# Patient Record
Sex: Male | Born: 1970 | Hispanic: Yes | Marital: Married | Smoking: Never smoker
Health system: Southern US, Community
[De-identification: ages and names within clinical notes are randomized; demographics above are authoritative.]

## PROBLEM LIST (undated history)

## (undated) DIAGNOSIS — K4091 Unilateral inguinal hernia, without obstruction or gangrene, recurrent: Secondary | ICD-10-CM

## (undated) DIAGNOSIS — C8588 Other specified types of non-Hodgkin lymphoma, lymph nodes of multiple sites: Secondary | ICD-10-CM

## (undated) DIAGNOSIS — C8310 Mantle cell lymphoma, unspecified site: Secondary | ICD-10-CM

## (undated) DIAGNOSIS — Z1239 Encounter for other screening for malignant neoplasm of breast: Secondary | ICD-10-CM

## (undated) DIAGNOSIS — N6459 Other signs and symptoms in breast: Secondary | ICD-10-CM

## (undated) DIAGNOSIS — E669 Obesity, unspecified: Secondary | ICD-10-CM

## (undated) DIAGNOSIS — C8590 Non-Hodgkin lymphoma, unspecified, unspecified site: Secondary | ICD-10-CM

## (undated) DIAGNOSIS — R030 Elevated blood-pressure reading, without diagnosis of hypertension: Secondary | ICD-10-CM

## (undated) DIAGNOSIS — E785 Hyperlipidemia, unspecified: Secondary | ICD-10-CM

## (undated) DIAGNOSIS — N5089 Other specified disorders of the male genital organs: Secondary | ICD-10-CM

## (undated) DIAGNOSIS — K8010 Calculus of gallbladder with chronic cholecystitis without obstruction: Secondary | ICD-10-CM

## (undated) HISTORY — DX: Other specified types of non-hodgkin lymphoma, lymph nodes of multiple sites: C85.88

## (undated) HISTORY — DX: Obesity, unspecified: E66.9

## (undated) HISTORY — DX: Calculus of gallbladder with chronic cholecystitis without obstruction: K80.10

## (undated) HISTORY — DX: Unilateral inguinal hernia, without obstruction or gangrene, recurrent: K40.91

## (undated) HISTORY — DX: Hyperlipidemia, unspecified: E78.5

## (undated) HISTORY — DX: Other signs and symptoms in breast: N64.59

## (undated) HISTORY — DX: Encounter for other screening for malignant neoplasm of breast: Z12.39

## (undated) HISTORY — PX: PORTA CATH REMOVAL: CATH118286

## (undated) HISTORY — DX: Non-Hodgkin lymphoma, unspecified, unspecified site: C85.90

---

## 2010-08-23 DIAGNOSIS — C8588 Other specified types of non-Hodgkin lymphoma, lymph nodes of multiple sites: Secondary | ICD-10-CM

## 2010-08-23 DIAGNOSIS — N6459 Other signs and symptoms in breast: Secondary | ICD-10-CM

## 2010-08-23 HISTORY — DX: Other specified types of non-hodgkin lymphoma, lymph nodes of multiple sites: C85.88

## 2010-08-23 HISTORY — PX: AXILLARY LYMPH NODE BIOPSY: SHX5737

## 2010-08-23 HISTORY — DX: Other signs and symptoms in breast: N64.59

## 2011-07-26 ENCOUNTER — Ambulatory Visit: Payer: Self-pay | Admitting: Family Medicine

## 2011-07-30 ENCOUNTER — Ambulatory Visit: Payer: Self-pay | Admitting: Family Medicine

## 2011-08-05 ENCOUNTER — Ambulatory Visit: Payer: Self-pay | Admitting: Oncology

## 2011-08-10 ENCOUNTER — Ambulatory Visit: Payer: Self-pay | Admitting: Oncology

## 2011-08-16 ENCOUNTER — Ambulatory Visit: Payer: Self-pay | Admitting: General Surgery

## 2011-08-20 ENCOUNTER — Ambulatory Visit: Payer: Self-pay | Admitting: General Surgery

## 2011-08-24 ENCOUNTER — Ambulatory Visit: Payer: Self-pay | Admitting: Oncology

## 2011-08-24 DIAGNOSIS — Z1239 Encounter for other screening for malignant neoplasm of breast: Secondary | ICD-10-CM

## 2011-08-24 DIAGNOSIS — K8010 Calculus of gallbladder with chronic cholecystitis without obstruction: Secondary | ICD-10-CM

## 2011-08-24 DIAGNOSIS — E669 Obesity, unspecified: Secondary | ICD-10-CM

## 2011-08-24 HISTORY — DX: Obesity, unspecified: E66.9

## 2011-08-24 HISTORY — PX: CHOLECYSTECTOMY: SHX55

## 2011-08-24 HISTORY — PX: BONE MARROW TRANSPLANT: SHX200

## 2011-08-24 HISTORY — PX: PORTACATH PLACEMENT: SHX2246

## 2011-08-24 HISTORY — PX: HERNIA REPAIR: SHX51

## 2011-08-24 HISTORY — DX: Encounter for other screening for malignant neoplasm of breast: Z12.39

## 2011-08-24 HISTORY — DX: Calculus of gallbladder with chronic cholecystitis without obstruction: K80.10

## 2011-08-31 ENCOUNTER — Ambulatory Visit: Payer: Self-pay | Admitting: Oncology

## 2011-08-31 LAB — CBC CANCER CENTER
Basophil #: 0 x10 3/mm (ref 0.0–0.1)
Eosinophil: 3 %
HCT: 40.7 % (ref 40.0–52.0)
Lymphocyte %: 24.5 %
Lymphocytes: 29 %
MCHC: 34.1 g/dL (ref 32.0–36.0)
Monocyte %: 4.9 %
Monocytes: 7 %
Neutrophil %: 67.9 %
Platelet: 246 x10 3/mm (ref 150–440)
RDW: 13.9 % (ref 11.5–14.5)
Segmented Neutrophils: 61 %

## 2011-09-03 ENCOUNTER — Ambulatory Visit: Payer: Self-pay | Admitting: General Surgery

## 2011-09-07 LAB — CBC CANCER CENTER
Basophil #: 0 x10 3/mm (ref 0.0–0.1)
Basophil %: 0.5 %
HCT: 40.5 % (ref 40.0–52.0)
Lymphocyte #: 1.4 x10 3/mm (ref 1.0–3.6)
Lymphocyte %: 29.4 %
MCH: 30.3 pg (ref 26.0–34.0)
MCHC: 34.5 g/dL (ref 32.0–36.0)
Neutrophil %: 58.8 %
Platelet: 222 x10 3/mm (ref 150–440)
RDW: 14.4 % (ref 11.5–14.5)
WBC: 4.8 x10 3/mm (ref 3.8–10.6)

## 2011-09-07 LAB — COMPREHENSIVE METABOLIC PANEL
Albumin: 3.7 g/dL (ref 3.4–5.0)
Alkaline Phosphatase: 87 U/L (ref 50–136)
Anion Gap: 4 — ABNORMAL LOW (ref 7–16)
BUN: 17 mg/dL (ref 7–18)
Bilirubin,Total: 0.3 mg/dL (ref 0.2–1.0)
Chloride: 104 mmol/L (ref 98–107)
Co2: 31 mmol/L (ref 21–32)
Creatinine: 0.77 mg/dL (ref 0.60–1.30)
Glucose: 93 mg/dL (ref 65–99)
Osmolality: 279 (ref 275–301)
SGOT(AST): 16 U/L (ref 15–37)
SGPT (ALT): 24 U/L
Sodium: 139 mmol/L (ref 136–145)
Total Protein: 7.5 g/dL (ref 6.4–8.2)

## 2011-09-17 LAB — COMPREHENSIVE METABOLIC PANEL
Anion Gap: 8 (ref 7–16)
Bilirubin,Total: 0.2 mg/dL (ref 0.2–1.0)
Calcium, Total: 8.3 mg/dL — ABNORMAL LOW (ref 8.5–10.1)
Chloride: 103 mmol/L (ref 98–107)
Co2: 28 mmol/L (ref 21–32)
Creatinine: 1.03 mg/dL (ref 0.60–1.30)
EGFR (African American): >60
EGFR (Non-African Amer.): >60
Osmolality: 277 (ref 275–301)
Potassium: 3.9 mmol/L (ref 3.5–5.1)
Sodium: 139 mmol/L (ref 136–145)
Total Protein: 7.2 g/dL (ref 6.4–8.2)

## 2011-09-17 LAB — CBC CANCER CENTER
Basophil #: 0 x10 3/mm (ref 0.0–0.1)
Basophil %: 0.1 %
Eosinophil %: 1.9 %
HCT: 40.5 % (ref 40.0–52.0)
HGB: 13.9 g/dL (ref 13.0–18.0)
Lymphocyte #: 1.1 x10 3/mm (ref 1.0–3.6)
Lymphocyte %: 8.2 %
MCV: 88 fL (ref 80–100)
Monocyte %: 8.2 %
Neutrophil #: 11 x10 3/mm — ABNORMAL HIGH (ref 1.4–6.5)
Neutrophil %: 81.6 %
Platelet: 173 x10 3/mm (ref 150–440)
RDW: 14.6 % — ABNORMAL HIGH (ref 11.5–14.5)

## 2011-09-24 ENCOUNTER — Ambulatory Visit: Payer: Self-pay | Admitting: Oncology

## 2011-09-30 LAB — COMPREHENSIVE METABOLIC PANEL
Albumin: 3.5 g/dL (ref 3.4–5.0)
Alkaline Phosphatase: 106 U/L (ref 50–136)
BUN: 15 mg/dL (ref 7–18)
Bilirubin,Total: 0.2 mg/dL (ref 0.2–1.0)
Calcium, Total: 8.3 mg/dL — ABNORMAL LOW (ref 8.5–10.1)
Creatinine: 0.82 mg/dL (ref 0.60–1.30)
Glucose: 103 mg/dL — ABNORMAL HIGH (ref 65–99)
Osmolality: 282 (ref 275–301)
Sodium: 141 mmol/L (ref 136–145)
Total Protein: 7 g/dL (ref 6.4–8.2)

## 2011-09-30 LAB — CBC CANCER CENTER
Basophil %: 0.9 %
Eosinophil #: 0.3 x10 3/mm (ref 0.0–0.7)
HGB: 13.1 g/dL (ref 13.0–18.0)
MCH: 30.3 pg (ref 26.0–34.0)
MCHC: 34.8 g/dL (ref 32.0–36.0)
MCV: 87 fL (ref 80–100)
Monocyte #: 0.5 x10 3/mm (ref 0.0–0.7)
Neutrophil #: 2.2 x10 3/mm (ref 1.4–6.5)
Neutrophil %: 55.6 %
RBC: 4.3 10*6/uL — ABNORMAL LOW (ref 4.40–5.90)
RDW: 14.4 % (ref 11.5–14.5)

## 2011-10-06 LAB — COMPREHENSIVE METABOLIC PANEL
Bilirubin,Total: 0.2 mg/dL (ref 0.2–1.0)
Calcium, Total: 8.4 mg/dL — ABNORMAL LOW (ref 8.5–10.1)
Chloride: 104 mmol/L (ref 98–107)
Co2: 27 mmol/L (ref 21–32)
EGFR (African American): >60
EGFR (Non-African Amer.): >60
Glucose: 97 mg/dL (ref 65–99)
Osmolality: 279 (ref 275–301)
Potassium: 4.2 mmol/L (ref 3.5–5.1)
SGOT(AST): 19 U/L (ref 15–37)
Sodium: 139 mmol/L (ref 136–145)

## 2011-10-06 LAB — CBC CANCER CENTER
Basophil %: 0.1 %
HCT: 39 % — ABNORMAL LOW (ref 40.0–52.0)
HGB: 13.3 g/dL (ref 13.0–18.0)
Lymphocyte #: 0.8 x10 3/mm — ABNORMAL LOW (ref 1.0–3.6)
Lymphocyte %: 5.9 %
MCHC: 34.2 g/dL (ref 32.0–36.0)
MCV: 88 fL (ref 80–100)
Monocyte #: 0.9 x10 3/mm — ABNORMAL HIGH (ref 0.0–0.7)
Monocyte %: 6.4 %
RBC: 4.45 10*6/uL (ref 4.40–5.90)
WBC: 13.8 x10 3/mm — ABNORMAL HIGH (ref 3.8–10.6)

## 2011-10-21 LAB — CBC CANCER CENTER
Basophil %: 0.6 %
Eosinophil %: 5.7 %
HCT: 39.6 % — ABNORMAL LOW (ref 40.0–52.0)
HGB: 13.6 g/dL (ref 13.0–18.0)
Lymphocyte #: 1.4 x10 3/mm (ref 1.0–3.6)
Lymphocyte %: 30.9 %
MCH: 30.2 pg (ref 26.0–34.0)
Monocyte #: 0.6 x10 3/mm (ref 0.0–0.7)
Monocyte %: 13.1 %
Neutrophil #: 2.3 x10 3/mm (ref 1.4–6.5)
RBC: 4.52 10*6/uL (ref 4.40–5.90)

## 2011-10-21 LAB — COMPREHENSIVE METABOLIC PANEL
Alkaline Phosphatase: 97 U/L (ref 50–136)
Anion Gap: 13 (ref 7–16)
BUN: 21 mg/dL — ABNORMAL HIGH (ref 7–18)
Bilirubin,Total: 0.3 mg/dL (ref 0.2–1.0)
Chloride: 102 mmol/L (ref 98–107)
Co2: 27 mmol/L (ref 21–32)
Creatinine: 1 mg/dL (ref 0.60–1.30)
EGFR (African American): >60
Glucose: 92 mg/dL (ref 65–99)
Potassium: 3.8 mmol/L (ref 3.5–5.1)
SGOT(AST): 30 U/L (ref 15–37)
Total Protein: 7.3 g/dL (ref 6.4–8.2)

## 2011-10-22 ENCOUNTER — Ambulatory Visit: Payer: Self-pay | Admitting: Oncology

## 2011-10-28 LAB — CBC CANCER CENTER
Basophil #: 0.1 x10 3/mm (ref 0.0–0.1)
Eosinophil #: 0.5 x10 3/mm (ref 0.0–0.7)
HCT: 39.3 % — ABNORMAL LOW (ref 40.0–52.0)
Lymphocyte #: 0.9 x10 3/mm — ABNORMAL LOW (ref 1.0–3.6)
Lymphocyte %: 6.4 %
MCH: 30.8 pg (ref 26.0–34.0)
MCHC: 34.8 g/dL (ref 32.0–36.0)
MCV: 89 fL (ref 80–100)
Monocyte %: 10.5 %
Neutrophil #: 11.2 x10 3/mm — ABNORMAL HIGH (ref 1.4–6.5)
Platelet: 216 x10 3/mm (ref 150–440)
RDW: 15.8 % — ABNORMAL HIGH (ref 11.5–14.5)
WBC: 14.2 x10 3/mm — ABNORMAL HIGH (ref 3.8–10.6)

## 2011-10-28 LAB — COMPREHENSIVE METABOLIC PANEL
Albumin: 3.8 g/dL (ref 3.4–5.0)
Alkaline Phosphatase: 196 U/L — ABNORMAL HIGH (ref 50–136)
Bilirubin,Total: 0.2 mg/dL (ref 0.2–1.0)
Calcium, Total: 8.7 mg/dL (ref 8.5–10.1)
Chloride: 103 mmol/L (ref 98–107)
Creatinine: 0.88 mg/dL (ref 0.60–1.30)
EGFR (African American): >60
SGPT (ALT): 54 U/L
Total Protein: 7.3 g/dL (ref 6.4–8.2)

## 2011-11-11 LAB — COMPREHENSIVE METABOLIC PANEL
Albumin: 3.9 g/dL (ref 3.4–5.0)
Anion Gap: 7 (ref 7–16)
Calcium, Total: 8.7 mg/dL (ref 8.5–10.1)
Co2: 27 mmol/L (ref 21–32)
EGFR (Non-African Amer.): >60
Glucose: 107 mg/dL — ABNORMAL HIGH (ref 65–99)
Osmolality: 281 (ref 275–301)
Potassium: 3.5 mmol/L (ref 3.5–5.1)
SGOT(AST): 31 U/L (ref 15–37)
Sodium: 140 mmol/L (ref 136–145)
Total Protein: 7 g/dL (ref 6.4–8.2)

## 2011-11-11 LAB — CBC CANCER CENTER
Eosinophil #: 0.3 x10 3/mm (ref 0.0–0.7)
Eosinophil %: 6.3 %
Lymphocyte #: 1 x10 3/mm (ref 1.0–3.6)
Lymphocyte %: 20.3 %
MCHC: 34.3 g/dL (ref 32.0–36.0)
MCV: 88 fL (ref 80–100)
Monocyte %: 10.2 %
Platelet: 253 x10 3/mm (ref 150–440)
RBC: 4.5 10*6/uL (ref 4.40–5.90)
RDW: 15.6 % — ABNORMAL HIGH (ref 11.5–14.5)
WBC: 4.9 x10 3/mm (ref 3.8–10.6)

## 2011-11-18 LAB — CBC CANCER CENTER
Basophil #: 0 x10 3/mm (ref 0.0–0.1)
Basophil %: 0.1 %
Eosinophil %: 5.4 %
HCT: 38.5 % — ABNORMAL LOW (ref 40.0–52.0)
Lymphocyte #: 0.6 x10 3/mm — ABNORMAL LOW (ref 1.0–3.6)
Lymphocyte %: 6 %
MCHC: 34 g/dL (ref 32.0–36.0)
MCV: 89 fL (ref 80–100)
Neutrophil %: 81.4 %
Platelet: 201 x10 3/mm (ref 150–440)
RDW: 16.1 % — ABNORMAL HIGH (ref 11.5–14.5)

## 2011-11-18 LAB — COMPREHENSIVE METABOLIC PANEL
Albumin: 3.4 g/dL (ref 3.4–5.0)
Alkaline Phosphatase: 185 U/L — ABNORMAL HIGH (ref 50–136)
Anion Gap: 8 (ref 7–16)
BUN: 15 mg/dL (ref 7–18)
Calcium, Total: 8.2 mg/dL — ABNORMAL LOW (ref 8.5–10.1)
Chloride: 107 mmol/L (ref 98–107)
Creatinine: 0.9 mg/dL (ref 0.60–1.30)
EGFR (Non-African Amer.): >60
Glucose: 104 mg/dL — ABNORMAL HIGH (ref 65–99)
Osmolality: 286 (ref 275–301)
Potassium: 4 mmol/L (ref 3.5–5.1)
SGPT (ALT): 120 U/L — ABNORMAL HIGH

## 2011-11-22 ENCOUNTER — Ambulatory Visit: Payer: Self-pay | Admitting: Oncology

## 2011-12-02 LAB — COMPREHENSIVE METABOLIC PANEL
Anion Gap: 9 (ref 7–16)
BUN: 18 mg/dL (ref 7–18)
Calcium, Total: 8.2 mg/dL — ABNORMAL LOW (ref 8.5–10.1)
Co2: 26 mmol/L (ref 21–32)
Creatinine: 0.85 mg/dL (ref 0.60–1.30)
EGFR (African American): >60
SGOT(AST): 25 U/L (ref 15–37)
Total Protein: 7 g/dL (ref 6.4–8.2)

## 2011-12-02 LAB — CBC CANCER CENTER
Basophil #: 0 x10 3/mm (ref 0.0–0.1)
Basophil %: 0.7 %
HCT: 37.6 % — ABNORMAL LOW (ref 40.0–52.0)
HGB: 12.9 g/dL — ABNORMAL LOW (ref 13.0–18.0)
Lymphocyte #: 1 x10 3/mm (ref 1.0–3.6)
MCHC: 34.2 g/dL (ref 32.0–36.0)
Monocyte #: 0.4 x10 3/mm (ref 0.2–1.0)
Neutrophil #: 2.3 x10 3/mm (ref 1.4–6.5)
RDW: 15.5 % — ABNORMAL HIGH (ref 11.5–14.5)
WBC: 4.1 x10 3/mm (ref 3.8–10.6)

## 2011-12-09 LAB — COMPREHENSIVE METABOLIC PANEL
Albumin: 3.9 g/dL (ref 3.4–5.0)
BUN: 14 mg/dL (ref 7–18)
Co2: 28 mmol/L (ref 21–32)
Creatinine: 0.78 mg/dL (ref 0.60–1.30)
EGFR (Non-African Amer.): >60
Osmolality: 285 (ref 275–301)
Potassium: 3.8 mmol/L (ref 3.5–5.1)
Sodium: 143 mmol/L (ref 136–145)
Total Protein: 7.3 g/dL (ref 6.4–8.2)

## 2011-12-09 LAB — CBC CANCER CENTER
Basophil #: 0.1 x10 3/mm (ref 0.0–0.1)
Basophil %: 0.7 %
Eosinophil #: 0.7 x10 3/mm (ref 0.0–0.7)
HGB: 13.9 g/dL (ref 13.0–18.0)
Lymphocyte #: 0.7 x10 3/mm — ABNORMAL LOW (ref 1.0–3.6)
MCHC: 33.2 g/dL (ref 32.0–36.0)
Neutrophil #: 10.1 x10 3/mm — ABNORMAL HIGH (ref 1.4–6.5)
Neutrophil %: 77.6 %
Platelet: 241 x10 3/mm (ref 150–440)
RBC: 4.68 10*6/uL (ref 4.40–5.90)
RDW: 15.5 % — ABNORMAL HIGH (ref 11.5–14.5)

## 2011-12-22 ENCOUNTER — Ambulatory Visit: Payer: Self-pay | Admitting: Oncology

## 2011-12-23 LAB — COMPREHENSIVE METABOLIC PANEL
Albumin: 3.9 g/dL (ref 3.4–5.0)
Alkaline Phosphatase: 90 U/L (ref 50–136)
BUN: 18 mg/dL (ref 7–18)
Bilirubin,Total: 0.4 mg/dL (ref 0.2–1.0)
Calcium, Total: 8.7 mg/dL (ref 8.5–10.1)
Chloride: 106 mmol/L (ref 98–107)
Creatinine: 0.77 mg/dL (ref 0.60–1.30)
EGFR (African American): >60
SGOT(AST): 28 U/L (ref 15–37)
Total Protein: 7.2 g/dL (ref 6.4–8.2)

## 2011-12-23 LAB — CBC CANCER CENTER
Basophil #: 0.1 x10 3/mm (ref 0.0–0.1)
Basophil %: 1.8 %
Eosinophil #: 0.5 x10 3/mm (ref 0.0–0.7)
Eosinophil %: 10.2 %
HCT: 40 % (ref 40.0–52.0)
MCH: 29.8 pg (ref 26.0–34.0)
MCV: 89 fL (ref 80–100)
Monocyte #: 0.6 x10 3/mm (ref 0.2–1.0)
Monocyte %: 12.7 %
Neutrophil %: 55.9 %
Platelet: 267 x10 3/mm (ref 150–440)
RBC: 4.51 10*6/uL (ref 4.40–5.90)
RDW: 15.4 % — ABNORMAL HIGH (ref 11.5–14.5)

## 2012-01-04 LAB — CBC CANCER CENTER
Basophil %: 0.8 %
Comment - H1-Com1: NORMAL
Eosinophil #: 0.3 x10 3/mm (ref 0.0–0.7)
Eosinophil: 4 %
HGB: 13.2 g/dL (ref 13.0–18.0)
Lymphocyte #: 0.7 x10 3/mm — ABNORMAL LOW (ref 1.0–3.6)
Lymphocyte %: 14.5 %
MCV: 90 fL (ref 80–100)
Monocyte #: 0.4 x10 3/mm (ref 0.2–1.0)
Monocyte %: 8.3 %
Monocytes: 5 %
Neutrophil #: 3.3 x10 3/mm (ref 1.4–6.5)
Neutrophil %: 70.8 %
RBC: 4.47 10*6/uL (ref 4.40–5.90)
WBC: 4.6 x10 3/mm (ref 3.8–10.6)

## 2012-01-13 ENCOUNTER — Ambulatory Visit: Payer: Self-pay | Admitting: Oncology

## 2012-01-22 ENCOUNTER — Ambulatory Visit: Payer: Self-pay | Admitting: Oncology

## 2012-01-24 ENCOUNTER — Ambulatory Visit: Payer: Self-pay | Admitting: General Surgery

## 2012-01-24 LAB — HEPATIC FUNCTION PANEL A (ARMC)
Albumin: 3.9 g/dL (ref 3.4–5.0)
Alkaline Phosphatase: 72 U/L (ref 50–136)
Bilirubin, Direct: <0.1 mg/dL (ref 0.00–0.20)
SGPT (ALT): 33 U/L
Total Protein: 7.2 g/dL (ref 6.4–8.2)

## 2012-01-24 LAB — CBC WITH DIFFERENTIAL/PLATELET
Basophil %: 1.3 %
HGB: 13.4 g/dL (ref 13.0–18.0)
Lymphocyte %: 27.9 %
MCHC: 33.9 g/dL (ref 32.0–36.0)
MCV: 89 fL (ref 80–100)
Monocyte %: 10.8 %
Neutrophil %: 50.9 %
RBC: 4.43 10*6/uL (ref 4.40–5.90)
RDW: 14.1 % (ref 11.5–14.5)

## 2012-01-31 ENCOUNTER — Ambulatory Visit: Payer: Self-pay | Admitting: General Surgery

## 2012-02-01 LAB — PATHOLOGY REPORT

## 2012-03-22 ENCOUNTER — Ambulatory Visit: Payer: Self-pay | Admitting: Oncology

## 2012-03-22 DIAGNOSIS — I059 Rheumatic mitral valve disease, unspecified: Secondary | ICD-10-CM

## 2012-05-08 ENCOUNTER — Ambulatory Visit: Payer: Self-pay | Admitting: Oncology

## 2012-05-11 ENCOUNTER — Ambulatory Visit: Payer: Self-pay | Admitting: Oncology

## 2012-06-22 ENCOUNTER — Ambulatory Visit: Payer: Self-pay | Admitting: Oncology

## 2012-06-22 LAB — CBC CANCER CENTER
Basophil #: 0.1 x10 3/mm (ref 0.0–0.1)
Basophil %: 0.8 %
Eosinophil #: 0.5 x10 3/mm (ref 0.0–0.7)
HGB: 12.6 g/dL — ABNORMAL LOW (ref 13.0–18.0)
Lymphocyte %: 26.2 %
MCH: 31.1 pg (ref 26.0–34.0)
MCHC: 33.4 g/dL (ref 32.0–36.0)
Monocyte #: 0.6 x10 3/mm (ref 0.2–1.0)
Neutrophil %: 59 %
Platelet: 210 x10 3/mm (ref 150–440)

## 2012-06-22 LAB — COMPREHENSIVE METABOLIC PANEL
Alkaline Phosphatase: 81 U/L (ref 50–136)
Anion Gap: 13 (ref 7–16)
Bilirubin,Total: 0.2 mg/dL (ref 0.2–1.0)
Chloride: 104 mmol/L (ref 98–107)
Co2: 22 mmol/L (ref 21–32)
Creatinine: 0.77 mg/dL (ref 0.60–1.30)
EGFR (Non-African Amer.): >60
Osmolality: 280 (ref 275–301)
SGOT(AST): 32 U/L (ref 15–37)

## 2012-06-23 ENCOUNTER — Ambulatory Visit: Payer: Self-pay | Admitting: Oncology

## 2012-06-29 LAB — COMPREHENSIVE METABOLIC PANEL
Alkaline Phosphatase: 74 U/L (ref 50–136)
Calcium, Total: 9 mg/dL (ref 8.5–10.1)
Co2: 25 mmol/L (ref 21–32)
EGFR (African American): >60
EGFR (Non-African Amer.): >60
Potassium: 3.6 mmol/L (ref 3.5–5.1)
SGOT(AST): 55 U/L — ABNORMAL HIGH (ref 15–37)
SGPT (ALT): 118 U/L — ABNORMAL HIGH (ref 12–78)
Sodium: 140 mmol/L (ref 136–145)

## 2012-06-29 LAB — CBC CANCER CENTER
Basophil #: 0.1 x10 3/mm (ref 0.0–0.1)
Lymphocyte %: 39.4 %
Monocyte %: 8.4 %
Platelet: 181 x10 3/mm (ref 150–440)
RBC: 4.22 10*6/uL — ABNORMAL LOW (ref 4.40–5.90)
RDW: 18.1 % — ABNORMAL HIGH (ref 11.5–14.5)
WBC: 7.3 x10 3/mm (ref 3.8–10.6)

## 2012-07-06 LAB — CBC CANCER CENTER
Basophil #: 0.1 x10 3/mm (ref 0.0–0.1)
Eosinophil #: 0.4 x10 3/mm (ref 0.0–0.7)
HCT: 39.4 % — ABNORMAL LOW (ref 40.0–52.0)
Lymphocyte %: 47.5 %
MCH: 31.7 pg (ref 26.0–34.0)
MCV: 94 fL (ref 80–100)
Monocyte %: 7.9 %
Neutrophil %: 37.7 %
RDW: 18 % — ABNORMAL HIGH (ref 11.5–14.5)
WBC: 8 x10 3/mm (ref 3.8–10.6)

## 2012-07-06 LAB — COMPREHENSIVE METABOLIC PANEL
Anion Gap: 11 (ref 7–16)
BUN: 19 mg/dL — ABNORMAL HIGH (ref 7–18)
Bilirubin,Total: 0.4 mg/dL (ref 0.2–1.0)
Calcium, Total: 9.1 mg/dL (ref 8.5–10.1)
Chloride: 105 mmol/L (ref 98–107)
Creatinine: 0.9 mg/dL (ref 0.60–1.30)
EGFR (African American): >60
Glucose: 94 mg/dL (ref 65–99)
Osmolality: 283 (ref 275–301)
Potassium: 3.9 mmol/L (ref 3.5–5.1)
SGPT (ALT): 71 U/L (ref 12–78)
Total Protein: 6.7 g/dL (ref 6.4–8.2)

## 2012-07-23 ENCOUNTER — Ambulatory Visit: Payer: Self-pay | Admitting: Oncology

## 2012-07-24 ENCOUNTER — Ambulatory Visit: Payer: Self-pay | Admitting: Oncology

## 2012-08-23 DIAGNOSIS — K4091 Unilateral inguinal hernia, without obstruction or gangrene, recurrent: Secondary | ICD-10-CM

## 2012-08-23 HISTORY — PX: HERNIA REPAIR: SHX51

## 2012-08-23 HISTORY — DX: Unilateral inguinal hernia, without obstruction or gangrene, recurrent: K40.91

## 2012-09-21 ENCOUNTER — Ambulatory Visit: Payer: Self-pay | Admitting: Oncology

## 2012-09-21 LAB — CBC CANCER CENTER
Basophil #: 0 x10 3/mm (ref 0.0–0.1)
Basophil %: 0.4 %
Eosinophil #: 0.2 x10 3/mm (ref 0.0–0.7)
HGB: 14.1 g/dL (ref 13.0–18.0)
Lymphocyte #: 2.7 x10 3/mm (ref 1.0–3.6)
Lymphocyte %: 38.8 %
MCH: 32.4 pg (ref 26.0–34.0)
MCHC: 34.6 g/dL (ref 32.0–36.0)
MCV: 94 fL (ref 80–100)
Monocyte #: 0.4 x10 3/mm (ref 0.2–1.0)
Monocyte %: 6.5 %
Neutrophil #: 3.6 x10 3/mm (ref 1.4–6.5)
RBC: 4.35 10*6/uL — ABNORMAL LOW (ref 4.40–5.90)
WBC: 6.9 x10 3/mm (ref 3.8–10.6)

## 2012-09-21 LAB — LACTATE DEHYDROGENASE: LDH: 187 U/L (ref 85–241)

## 2012-09-23 ENCOUNTER — Ambulatory Visit: Payer: Self-pay | Admitting: Oncology

## 2012-10-21 ENCOUNTER — Ambulatory Visit: Payer: Self-pay | Admitting: Oncology

## 2012-10-26 ENCOUNTER — Ambulatory Visit: Payer: Self-pay | Admitting: General Surgery

## 2012-10-26 LAB — CBC WITH DIFFERENTIAL/PLATELET
Basophil #: 0 10*3/uL (ref 0.0–0.1)
Basophil %: 0.7 %
Eosinophil #: 0.4 10*3/uL (ref 0.0–0.7)
Eosinophil %: 9.7 %
HCT: 41.2 % (ref 40.0–52.0)
HGB: 14.2 g/dL (ref 13.0–18.0)
MCH: 31.5 pg (ref 26.0–34.0)
MCHC: 34.6 g/dL (ref 32.0–36.0)
MCV: 91 fL (ref 80–100)
Monocyte #: 0.3 x10 3/mm (ref 0.2–1.0)
Monocyte %: 7.1 %
Neutrophil #: 2.6 10*3/uL (ref 1.4–6.5)
Neutrophil %: 59.2 %
Platelet: 216 10*3/uL (ref 150–440)
RBC: 4.52 10*6/uL (ref 4.40–5.90)
RDW: 14 % (ref 11.5–14.5)
WBC: 4.4 10*3/uL (ref 3.8–10.6)

## 2012-11-03 ENCOUNTER — Ambulatory Visit: Payer: Self-pay | Admitting: General Surgery

## 2012-11-03 DIAGNOSIS — Z302 Encounter for sterilization: Secondary | ICD-10-CM

## 2012-11-03 DIAGNOSIS — K4091 Unilateral inguinal hernia, without obstruction or gangrene, recurrent: Secondary | ICD-10-CM

## 2012-11-03 HISTORY — PX: VASECTOMY: SHX75

## 2012-11-07 LAB — PATHOLOGY REPORT

## 2012-11-13 ENCOUNTER — Encounter: Payer: Self-pay | Admitting: *Deleted

## 2012-11-13 DIAGNOSIS — C8588 Other specified types of non-Hodgkin lymphoma, lymph nodes of multiple sites: Secondary | ICD-10-CM | POA: Insufficient documentation

## 2012-11-16 ENCOUNTER — Encounter: Payer: Self-pay | Admitting: General Surgery

## 2012-11-16 ENCOUNTER — Ambulatory Visit (INDEPENDENT_AMBULATORY_CARE_PROVIDER_SITE_OTHER): Payer: PRIVATE HEALTH INSURANCE | Admitting: General Surgery

## 2012-11-16 VITALS — BP 112/78 | HR 80 | Resp 14 | Ht 66.0 in | Wt 194.0 lb

## 2012-11-16 DIAGNOSIS — Z3009 Encounter for other general counseling and advice on contraception: Secondary | ICD-10-CM

## 2012-11-16 DIAGNOSIS — K4091 Unilateral inguinal hernia, without obstruction or gangrene, recurrent: Secondary | ICD-10-CM

## 2012-11-16 NOTE — Patient Instructions (Addendum)
The need to confirm sterility with two negative semen samples was emphasized. Instructions reviewed in detail regarding sample collection with pt understanding.  Plans PET scan December 19 2012

## 2012-11-16 NOTE — Progress Notes (Signed)
Patient here today for postoperative visit from right inguinal hernia repair and vasectomy.  Doing well "sore" only using 1 or 2 pain medications a day now. Incision clean and healing. Repair is intact

## 2012-11-17 ENCOUNTER — Encounter: Payer: Self-pay | Admitting: General Surgery

## 2012-11-17 DIAGNOSIS — K4091 Unilateral inguinal hernia, without obstruction or gangrene, recurrent: Secondary | ICD-10-CM | POA: Insufficient documentation

## 2012-11-21 ENCOUNTER — Ambulatory Visit: Payer: Self-pay | Admitting: Oncology

## 2012-11-24 ENCOUNTER — Other Ambulatory Visit: Payer: Self-pay | Admitting: *Deleted

## 2012-11-24 DIAGNOSIS — Z9852 Vasectomy status: Secondary | ICD-10-CM

## 2012-11-27 LAB — SEMEN ANALYSIS

## 2012-11-29 ENCOUNTER — Telehealth: Payer: Self-pay | Admitting: *Deleted

## 2012-11-29 NOTE — Telephone Encounter (Signed)
Reviewed specimen collection with the patient.  He states he has a cup and will get another specimen for the lab but it may be several weeks, aware of his appointment 12-18-12.

## 2012-11-29 NOTE — Telephone Encounter (Signed)
Message copied by Currie Paris on Wed Nov 29, 2012  9:28 AM ------      Message from: Kieth Brightly      Created: Tue Nov 28, 2012  8:27 PM       Any reason why this was not done?      ----- Message -----         From: [facility] Lab Results In Interface         Sent: 11/28/2012   6:47 PM           To: Kieth Brightly, MD                   ------

## 2012-12-18 ENCOUNTER — Ambulatory Visit: Payer: PRIVATE HEALTH INSURANCE | Admitting: General Surgery

## 2012-12-19 ENCOUNTER — Ambulatory Visit: Payer: Self-pay | Admitting: Oncology

## 2012-12-21 ENCOUNTER — Ambulatory Visit: Payer: Self-pay | Admitting: Oncology

## 2012-12-21 LAB — CBC CANCER CENTER
Basophil #: 0 x10 3/mm (ref 0.0–0.1)
Eosinophil #: 0.2 x10 3/mm (ref 0.0–0.7)
HCT: 37.9 % — ABNORMAL LOW (ref 40.0–52.0)
HGB: 12.7 g/dL — ABNORMAL LOW (ref 13.0–18.0)
Lymphocyte #: 2 x10 3/mm (ref 1.0–3.6)
Lymphocyte %: 44.8 %
MCH: 30.5 pg (ref 26.0–34.0)
MCHC: 33.5 g/dL (ref 32.0–36.0)
MCV: 91 fL (ref 80–100)
Neutrophil #: 1.8 x10 3/mm (ref 1.4–6.5)
Neutrophil %: 38.7 %
Platelet: 266 x10 3/mm (ref 150–440)
RBC: 4.17 10*6/uL — ABNORMAL LOW (ref 4.40–5.90)
RDW: 14.4 % (ref 11.5–14.5)

## 2012-12-21 LAB — LACTATE DEHYDROGENASE: LDH: 194 U/L (ref 85–241)

## 2012-12-28 ENCOUNTER — Ambulatory Visit (INDEPENDENT_AMBULATORY_CARE_PROVIDER_SITE_OTHER): Payer: PRIVATE HEALTH INSURANCE | Admitting: General Surgery

## 2012-12-28 ENCOUNTER — Encounter: Payer: Self-pay | Admitting: General Surgery

## 2012-12-28 VITALS — BP 122/80 | HR 72 | Resp 14 | Ht 63.0 in | Wt 193.0 lb

## 2012-12-28 DIAGNOSIS — K4091 Unilateral inguinal hernia, without obstruction or gangrene, recurrent: Secondary | ICD-10-CM

## 2012-12-28 DIAGNOSIS — Z9852 Vasectomy status: Secondary | ICD-10-CM

## 2012-12-28 NOTE — Progress Notes (Signed)
This is a 42 year old male following from an right inguinal hernia 11/03/12. Patient states he is doing. well. Also had vasectomy. He brought in first sample of semen for analysis today.  Right groin incision is well healed. No hernia noted.

## 2012-12-28 NOTE — Patient Instructions (Addendum)
Patient to call if any new problems. Report on semen analysis is will be called to him when available.

## 2012-12-29 LAB — SEMEN ANALYSIS

## 2013-01-21 ENCOUNTER — Ambulatory Visit: Payer: Self-pay | Admitting: Oncology

## 2013-02-20 ENCOUNTER — Ambulatory Visit: Payer: Self-pay | Admitting: Oncology

## 2013-02-21 ENCOUNTER — Telehealth: Payer: Self-pay | Admitting: *Deleted

## 2013-02-21 NOTE — Telephone Encounter (Signed)
Aware there was an error in test number used for semen count. Will bring another sample in couple of weeks.

## 2013-03-23 ENCOUNTER — Ambulatory Visit: Payer: Self-pay | Admitting: Oncology

## 2013-03-23 LAB — CBC CANCER CENTER
Basophil %: 0.5 %
HCT: 39.6 % — ABNORMAL LOW (ref 40.0–52.0)
HGB: 13.7 g/dL (ref 13.0–18.0)
Lymphocyte #: 2.6 x10 3/mm (ref 1.0–3.6)
Lymphocyte %: 45.9 %
MCV: 91 fL (ref 80–100)
Monocyte #: 0.4 x10 3/mm (ref 0.2–1.0)
Neutrophil #: 2.3 x10 3/mm (ref 1.4–6.5)
RBC: 4.37 10*6/uL — ABNORMAL LOW (ref 4.40–5.90)
RDW: 14.4 % (ref 11.5–14.5)
WBC: 5.6 x10 3/mm (ref 3.8–10.6)

## 2013-03-23 LAB — LACTATE DEHYDROGENASE: LDH: 176 U/L (ref 85–241)

## 2013-04-23 ENCOUNTER — Ambulatory Visit: Payer: Self-pay | Admitting: Oncology

## 2013-05-23 ENCOUNTER — Ambulatory Visit: Payer: Self-pay | Admitting: Oncology

## 2013-06-20 ENCOUNTER — Ambulatory Visit: Payer: Self-pay | Admitting: Oncology

## 2013-06-22 LAB — CBC CANCER CENTER
Basophil #: 0 x10 3/mm (ref 0.0–0.1)
Basophil %: 0.8 %
Eosinophil #: 0.3 x10 3/mm (ref 0.0–0.7)
Eosinophil %: 5.3 %
HGB: 13.3 g/dL (ref 13.0–18.0)
Lymphocyte #: 2.3 x10 3/mm (ref 1.0–3.6)
Lymphocyte %: 42 %
MCH: 30.7 pg (ref 26.0–34.0)
MCHC: 33.7 g/dL (ref 32.0–36.0)
MCV: 91 fL (ref 80–100)
Monocyte %: 9.8 %
Neutrophil #: 2.3 x10 3/mm (ref 1.4–6.5)
Neutrophil %: 42.1 %
Platelet: 193 x10 3/mm (ref 150–440)
RBC: 4.33 10*6/uL — ABNORMAL LOW (ref 4.40–5.90)
RDW: 14.2 % (ref 11.5–14.5)
WBC: 5.5 x10 3/mm (ref 3.8–10.6)

## 2013-06-22 LAB — LACTATE DEHYDROGENASE: LDH: 152 U/L (ref 85–241)

## 2013-06-22 IMAGING — PT NM PET TUM IMG RESTAG (PS) SKULL BASE T - THIGH
1 of 5 series · 2 of 25 positions shown · non-contrast
Comparison: none

REASON FOR EXAM: restaging mantle cell lymphoma
COMMENTS:

[Series 3: ct wb 3.0 b30f · axial · 3.0mm · 0.98mm/px · z∈[+630,+730]mm · 2 of 494 slices shown]
[im 444/494  brain]
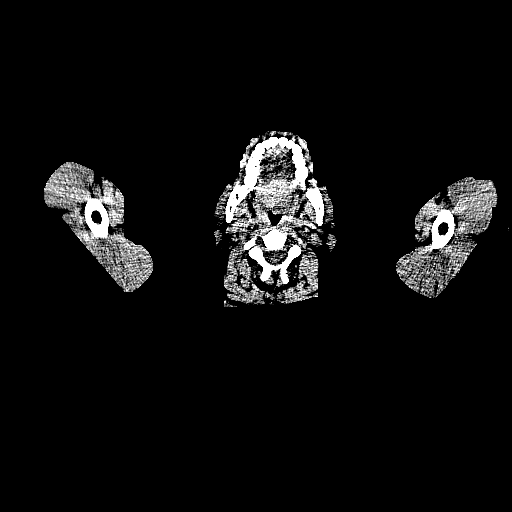
[im 494/494  brain]
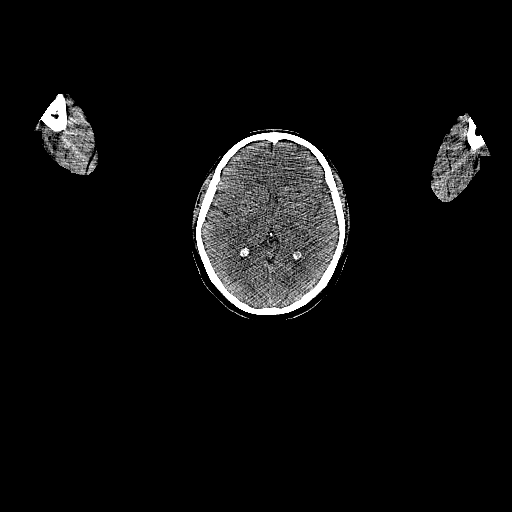

[2 of 25 positions shown; findings below may reference images not displayed]

PROCEDURE:     PET - PET/CT RESTAGING LYMPHOMA  - July 24, 2012 [DATE]

RESULT:     History: Lymphoma.

Comparison Study: PET/CT of 05/11/2012. Following determination of fasting
blood sugar 75 mg/dL 12.1 mCi of F-18 FDG was administered to the patient.
PET CT obtained. CT obtained for attenuation correction and fusion. No PET
positive abnormalities identified. Exam is unremarkable and stable from
prior study.
IMPRESSION: Negative exam.

## 2013-06-23 ENCOUNTER — Ambulatory Visit: Payer: Self-pay | Admitting: Oncology

## 2013-09-05 ENCOUNTER — Ambulatory Visit (INDEPENDENT_AMBULATORY_CARE_PROVIDER_SITE_OTHER): Payer: PRIVATE HEALTH INSURANCE | Admitting: General Surgery

## 2013-09-05 ENCOUNTER — Telehealth: Payer: Self-pay | Admitting: *Deleted

## 2013-09-05 ENCOUNTER — Encounter: Payer: Self-pay | Admitting: General Surgery

## 2013-09-05 VITALS — BP 142/80 | HR 74 | Resp 12 | Ht 63.0 in

## 2013-09-05 DIAGNOSIS — Z8579 Personal history of other malignant neoplasms of lymphoid, hematopoietic and related tissues: Secondary | ICD-10-CM

## 2013-09-05 DIAGNOSIS — K4091 Unilateral inguinal hernia, without obstruction or gangrene, recurrent: Secondary | ICD-10-CM

## 2013-09-05 DIAGNOSIS — Z87898 Personal history of other specified conditions: Secondary | ICD-10-CM

## 2013-09-05 NOTE — Telephone Encounter (Signed)
Patient aware Dr. Adalberto Cole office will be calling to arrange an appointment. They have to get clearance from his insurance first.   Caryl Pina at the [facility] has received records that we forwarded to them today.

## 2013-09-05 NOTE — Progress Notes (Signed)
Patient ID: Craig Williamson, male   DOB: 04/11/1971, 43 y.o.   MRN: [id number]  Chief Complaint  Patient presents with  . Hernia    HPI Craig Williamson is a 43 y.o. male.  Here today for follow up right inguinal hernia repair 11-03-12.  The area seems to be still swollen and painful. States he has a repeat PET scan for April at [facility]. 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 Also had RIH repair twice. His main complaint is pain.   HPI  Past Medical History  Diagnosis Date  . Calculus of gallbladder with other cholecystitis, without mention of obstruction 2013  . Inguinal hernia without mention of obstruction or gangrene, recurrent unilateral or unspecified 2014    right  . Breast screening, unspecified 2013  . Obesity, unspecified 2013  . 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 Breast complaint 2012    Past Surgical History  Procedure Laterality Date  . Hernia repair Right 2013  . Hernia repair Right 2014    recurrent RIH  . Vasectomy  11/03/2012  . Cholecystectomy  2013  . Portacath placement  2013  . Axillary lymph node biopsy Left 2012  . Bone marrow transplant  2013    No family history on file.  Social History History  Substance Use Topics  . Smoking status: Never Smoker   . Smokeless tobacco: Not on file  . Alcohol Use: No    No Known Allergies  No current outpatient prescriptions on file.   No current facility-administered medications for this visit.    Review of Systems Review of Systems  Constitutional: Negative.   Respiratory: Negative.   Cardiovascular: Negative.     Blood pressure 142/80, pulse 74, resp. rate 12, height 5\' 3"  (1.6 m).  Physical Exam Physical Exam  Constitutional: He is oriented to person, place, and time. He appears well-developed and well-nourished.  Eyes: No scleral icterus.  Neck: Neck supple.  Cardiovascular: Normal rate, regular rhythm and normal heart sounds.   Pulmonary/Chest: Effort  normal and breath sounds normal.  Abdominal: Soft. Normal appearance. There is no hepatosplenomegaly. Hernia: very small inpulse noted medial right groin, minimal swelling there when he stands up.  Lymphadenopathy:    He has no cervical adenopathy.    He has axillary adenopathy.  Neurological: He is alert and oriented to person, place, and time.  Skin: Skin is warm and dry.    Data Reviewed PET scan reviewed. Suggests mildly active nodes in both axillae  Assessment    Groin pain is likelyu from nerve irritation. May benefit with nerve block. Pt advised fully and he is agreeable.     Plan    Refer  to pain management-I discussed with Dr. Dossie Arbour and he will see pt.        Karie Fetch M 09/05/2013, 9:25 AM

## 2013-09-05 NOTE — Patient Instructions (Signed)
The patient is aware to call back for any questions or concerns.  

## 2013-09-24 ENCOUNTER — Ambulatory Visit: Payer: Self-pay | Admitting: Oncology

## 2013-10-25 ENCOUNTER — Ambulatory Visit: Payer: Self-pay | Admitting: Oncology

## 2014-01-08 ENCOUNTER — Ambulatory Visit: Payer: Self-pay | Admitting: Oncology

## 2014-01-08 LAB — LACTATE DEHYDROGENASE: LDH: 156 U/L (ref 85–241)

## 2014-01-08 LAB — CBC CANCER CENTER
BASOS ABS: 0 x10 3/mm (ref 0.0–0.1)
BASOS PCT: 0.6 %
EOS PCT: 5 %
Eosinophil #: 0.3 x10 3/mm (ref 0.0–0.7)
HCT: 39.6 % — ABNORMAL LOW (ref 40.0–52.0)
HGB: 13.6 g/dL (ref 13.0–18.0)
LYMPHS ABS: 2.7 x10 3/mm (ref 1.0–3.6)
Lymphocyte %: 46.2 %
MCH: 30.7 pg (ref 26.0–34.0)
MCHC: 34.3 g/dL (ref 32.0–36.0)
MCV: 90 fL (ref 80–100)
MONOS PCT: 6.1 %
Monocyte #: 0.4 x10 3/mm (ref 0.2–1.0)
Neutrophil #: 2.4 x10 3/mm (ref 1.4–6.5)
Neutrophil %: 42.1 %
Platelet: 212 x10 3/mm (ref 150–440)
RBC: 4.42 10*6/uL (ref 4.40–5.90)
RDW: 14.7 % — AB (ref 11.5–14.5)
WBC: 5.8 x10 3/mm (ref 3.8–10.6)

## 2014-01-15 ENCOUNTER — Ambulatory Visit: Payer: Self-pay | Admitting: Oncology

## 2014-01-21 ENCOUNTER — Ambulatory Visit: Payer: Self-pay | Admitting: Oncology

## 2014-04-24 ENCOUNTER — Ambulatory Visit: Payer: Self-pay | Admitting: Oncology

## 2014-05-23 ENCOUNTER — Ambulatory Visit: Payer: Self-pay | Admitting: Oncology

## 2014-10-22 ENCOUNTER — Ambulatory Visit (INDEPENDENT_AMBULATORY_CARE_PROVIDER_SITE_OTHER): Payer: PRIVATE HEALTH INSURANCE | Admitting: General Surgery

## 2014-10-22 ENCOUNTER — Encounter: Payer: Self-pay | Admitting: General Surgery

## 2014-10-22 VITALS — BP 140/80 | HR 76 | Resp 12 | Ht 63.0 in | Wt 198.0 lb

## 2014-10-22 DIAGNOSIS — Z8572 Personal history of non-Hodgkin lymphomas: Secondary | ICD-10-CM | POA: Diagnosis not present

## 2014-10-22 DIAGNOSIS — Z8579 Personal history of other malignant neoplasms of lymphoid, hematopoietic and related tissues: Secondary | ICD-10-CM

## 2014-10-22 NOTE — Progress Notes (Signed)
Patient ID: Craig Williamson, male   DOB: [birth year], 44 y.o.   MRN: [id number]  Chief Complaint  Patient presents with  . Procedure    port removal     HPI Craig Williamson is a 44 y.o. male here today for a port removal. Pt has had lymphoma and has undergone treatment. He has been stable with no evidence of recurrence. Oncologist felt port can be removed HPI  Past Medical History  Diagnosis Date  . Calculus of gallbladder with other cholecystitis, without mention of obstruction 2013  . Inguinal hernia without mention of obstruction or gangrene, recurrent unilateral or unspecified 2014    right  . Breast screening, unspecified 2013  . Obesity, unspecified 2013  . 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 Breast complaint 2012  . Lymphoma     Past Surgical History  Procedure Laterality Date  . Hernia repair Right 2013  . Hernia repair Right 2014    recurrent RIH  . Vasectomy  11/03/2012  . Cholecystectomy  2013  . Portacath placement  2013  . Axillary lymph node biopsy Left 2012  . Bone marrow transplant  2013    History reviewed. No pertinent family history.  Social History History  Substance Use Topics  . Smoking status: Never Smoker   . Smokeless tobacco: Not on file  . Alcohol Use: No    No Known Allergies  No current outpatient prescriptions on file.   No current facility-administered medications for this visit.    Review of Systems Review of Systems  Constitutional: Negative.   Respiratory: Negative.   Cardiovascular: Negative.     Blood pressure 140/80, pulse 76, resp. rate 12, height 5\' 3"  (1.6 m), weight 198 lb (89.812 kg).  Physical Exam Physical Exam  Data Reviewed   Assessment  Lymphoma in remission.    Plan        Procedure: removal venous port, left upper chest. Anesthetic: 1% xylocaine mixed with 0.5% marcaine, 20ml Prep: Chloro Prep  Area prepped and draped. Prior scar was excised out. Capsule around port  incised. 3 anchoring Prolene stitches removed. Port and catheter removed in full. 3-0 Vicryl placed in subcutaneous tissue. Skin closed with 4-0 Vicryl subcuticular stitch. Steri strips and telfa and tegaderm. No immediate problems from procedure.    SANKAR,SEEPLAPUTHUR G 10/22/2014, 10:49 AM

## 2014-10-22 NOTE — Patient Instructions (Addendum)
Patient to return as needed. Ice pack off and on today. Dressing can be removed in 3 days and steri strips in 1 week.

## 2014-12-13 NOTE — Op Note (Signed)
PATIENT NAME:  Craig Williamson, Craig Williamson MR#:  [id number] DATE OF BIRTH:  08-21-1971  DATE OF PROCEDURE:  11/03/2012  PREOPERATIVE DIAGNOSIS: Recurrent right inguinal hernia.   POSTOPERATIVE DIAGNOSIS: Recurrent right inguinal hernia.  OPERATION:  1. Repair of recurrent right inguinal hernia.  2. Bilateral vasectomy.  SURGEONS: S.G. Jamal Collin, MD, and Robert Bellow, MD.  COMPLICATIONS: None.   ESTIMATED BLOOD LOSS: Minimal.   DRAINS: None.   ANESTHESIA: General.   PROCEDURE: The patient was put to sleep in the supine position on the operating table. The right groin and the scrotal area were prepped and draped out as a sterile field. At the same time as the inguinal approach, the left spermatic cord in the upper scrotal region was exposed through a small incision by Dr. Bary Castilla, and the vasectomy was completed. This portion was dictated by him. The right-sided vasectomy was performed after repair of the recurrent hernia note. The previous skin incision was opened after instillation of local anesthetic and deepened through the layers down to the external oblique, with some scarring encountered. The external oblique was opened and noted that the cord was pretty much intact, but also the mesh, except for the fact that the medial and inferior edge of this mesh was lifting off, and it was not suitably transfixed. There did not appear to be any direct hernia of any preperitoneal tissue through this region. This was the only abnormality identified. The cord itself was inspected, and no indirect hernia was noted. Since the patient's primary symptom was one of pain associated with coughing, and the only finding was of a flap-like movement of the medial end of this graft, it was decided to tack this down. This was then tacked down to the pubic tubercle and the muscular tissue medially and the inguinal ligament inferiorly with 3 stitches of 2-0 PDS and appeared to stay in place without any mobility. After ensuring  hemostasis, the external oblique was closed with 2-0 PDS. Beyond the external ring area, the vas deferens were identified, it was freed, and a subcentimeter portion was resected and the 2 ends were then ligated with 3-0 Vicryl and the lumen closed with cautery. Procedure was completed similar to the left side. The wound was irrigated and closed with the subcutaneous tissue closed with 3-0 Vicryl, the skin with subcuticular 4-0 Vicryl, covered with Dermabond. The procedure was well tolerated. The patient tolerated the procedure well. There were no immediate problems encountered. He was extubated and returned to the recovery room in stable condition.   ____________________________ S.Robinette Haines, MD sgs:OSi D: 11/03/2012 15:43:16 ET T: 11/04/2012 12:21:31 ET JOB#: [id number]  cc: S.G. Jamal Collin, MD, <Dictator> [facility] Robinette Haines MD ELECTRONICALLY SIGNED 11/06/2012 18:11

## 2014-12-13 NOTE — Op Note (Signed)
PATIENT NAME:  Craig Williamson, Craig Williamson MR#:  [id number] DATE OF BIRTH:  Dec 23, 1970  DATE OF PROCEDURE:  11/03/2012  PREOPERATIVE DIAGNOSIS:  Desires sterilization.   POSTOPERATIVE DIAGNOSIS:  Desires sterilization.  OPERATIVE PROCEDURE:  Left vasectomy.   SURGEON:  Robert Bellow, MD  ANESTHESIA:  General endotracheal.   ESTIMATED BLOOD LOSS:  Minimal.   CLINICAL NOTE:  This 44 year old male was undergoing repair of recurrent right inguinal hernia. He had discussed elective vasectomy with his attending surgeon. The right vasectomy will be completed at the time of the hernia repair.   OPERATIVE NOTE:  The patient's abdomen was prepped with ChloraPrep and the perineum prepped with Betadine. A small 1 cm transverse incision was made in the left hemiscrotum and the left vas dissected free from the adjacent tissue. A 1 cm segment was excised. The vas was ligated with 3-0 Vicryl and the mucosa destroyed with electrocautery. The ends of the vas were separated by the external spermatic fascia with a 3-0 Vicryl pursestring suture. The skin was closed with a 3-0 Vicryl subcuticular suture.   The patient tolerated the procedure well. He has been instructed by his attending surgeon regarding the need for 2 negative semen samples prior to resuming unprotected intercourse.  ____________________________ Robert Bellow, MD jwb:jm D: 11/03/2012 18:29:52 ET T: 11/04/2012 13:53:59 ET JOB#: [id number]  cc: Robert Bellow, MD, <Dictator> JEFFREY Amedeo Kinsman MD ELECTRONICALLY SIGNED 11/05/2012 8:38

## 2014-12-15 NOTE — Op Note (Signed)
PATIENT NAME:  Craig Williamson, ISHIDA MR#:  [id number] DATE OF BIRTH:  13-Feb-1971  DATE OF PROCEDURE:  01/31/2012  PREOPERATIVE DIAGNOSES:  1. Cholelithiasis. 2. Right inguinal hernia.   POSTOPERATIVE DIAGNOSES:  1. Cholelithiasis. 2. Right inguinal hernia.   OPERATION:  1. Laparoscopy, cholecystectomy with cholangiogram.   2. Repair of right inguinal hernia.   SURGEON: Mckinley Jewel, MD   ANESTHESIA: General.   COMPLICATIONS: None.   ESTIMATED BLOOD LOSS: Approximately 50 mL.   DRAINS: None.   PROCEDURE: The patient was put to sleep in the supine position on the operating table. The abdomen was prepped and draped out as a sterile field. A small incision was made in the umbilicus. The Veress needle with the InnerDyne sleeve was positioned in the peritoneal cavity, verified with the hanging drop method, and pneumoperitoneum was obtained. A 10 mm port was placed. The camera was introduced. There was good visualization of the peritoneal cavity. Epigastric and two lateral 5 mm ports were placed. The gallbladder was noted to be mildly thickened and a few adhesions around the Hartmann's pouch were noted. These were easily taken down. The cystic duct was freed and a Kumar clamp and catheter were positioned. Cholangiogram was performed. There was preferential flow into the gallbladder, however, a portion of the common bile duct and the proximal hepatic duct were seen and did not appear in the visualized portions to have any filling defects. The cystic duct was fairly long and tortuous. The catheter was used to decompress the gallbladder and removed. The cystic duct was hemoclipped and cut. The cystic artery was freed, hemoclipped and cut. The gallbladder was dissected free from its bed using cautery for control of bleeding. A small amount of oozing was noted in the lower part of the gallbladder bed and this was covered with a small piece of Surgicel. A small amount of fluid was used to irrigate out the right  upper quadrant and suctioned out. The gallbladder was placed in an Endo bag and brought out through the umbilical port site and noted to contain multiple tiny stones. The fascial opening in the umbilicus was then closed with a 0 Vicryl placed with the use of a suture passer and tied down. Pneumoperitoneum was released and the remaining ports were then removed. Skin incisions were then closed with subcuticular 4-0 Vicryl and subsequently reinforced with Steri-Strips and Tegaderm dressings.  At this point gown and gloves were changed. The prior drape was removed. The right groin was then prepped and draped out as a sterile field. A standard right inguinal incision was made along the medial two-thirds of the inguinal canal and deepened through the layers down to the external oblique. Bleeding was controlled with cautery and ligatures of 3-0 Vicryl. It was noted the patient had a hernial protrusion going through the external ring. After the external oblique was opened along the line of its fibers it was noted the hernia was actually located in medial location along the posterior wall. This was a fairly long hernial protrusion with a well defined edge on the transversalis fascia of about two fingerbreadths size. No indirect sac was identified and on laparoscopy no indirect opening was noted on either side. The hernial protrusion was easily pushed back in and the transversalis fascia closed over with a running 2-0 PDS stitch. The ilioinguinal nerve was located and the superior part of the inguinal canal was preserved. A Pro Grip mesh was then placed on the posterior wall and around the internal ring  and the lateral ends tucked underneath the external oblique and smoothened out. Excess along the inferior edge was trimmed. External oblique was closed over with a running 2-0 PDS. The subcutaneous tissue was closed in two layers of 3-0 Vicryl and the skin closed with subcuticular 4-0 Vicryl reinforced with Steri-Strips. A  dry sterile dressing was placed. The patient tolerated the procedure well. There were no immediate problems encountered. He was extubated and returned to the recovery room in stable condition.   ____________________________ S.Robinette Haines, MD sgs:drc D: 01/31/2012 16:54:31 ET T: 02/01/2012 08:56:34 ET JOB#: [id number]  cc: Synthia Innocent. Jamal Collin, MD, <Dictator> [facility] Robinette Haines MD ELECTRONICALLY SIGNED 02/01/2012 17:22

## 2015-03-12 ENCOUNTER — Inpatient Hospital Stay: Payer: No Typology Code available for payment source | Attending: Oncology

## 2015-03-12 ENCOUNTER — Inpatient Hospital Stay (HOSPITAL_BASED_OUTPATIENT_CLINIC_OR_DEPARTMENT_OTHER): Payer: No Typology Code available for payment source | Admitting: Oncology

## 2015-03-12 VITALS — BP 148/94 | HR 76 | Temp 97.4°F | Resp 16 | Wt 198.6 lb

## 2015-03-12 DIAGNOSIS — Z8572 Personal history of non-Hodgkin lymphomas: Secondary | ICD-10-CM | POA: Insufficient documentation

## 2015-03-12 DIAGNOSIS — Z79899 Other long term (current) drug therapy: Secondary | ICD-10-CM | POA: Insufficient documentation

## 2015-03-12 DIAGNOSIS — C8310 Mantle cell lymphoma, unspecified site: Secondary | ICD-10-CM

## 2015-03-12 DIAGNOSIS — Z9484 Stem cells transplant status: Secondary | ICD-10-CM | POA: Diagnosis not present

## 2015-03-12 DIAGNOSIS — C8590 Non-Hodgkin lymphoma, unspecified, unspecified site: Secondary | ICD-10-CM

## 2015-03-12 LAB — LACTATE DEHYDROGENASE: LDH: 130 U/L (ref 98–192)

## 2015-03-12 LAB — CBC WITH DIFFERENTIAL/PLATELET
Basophils Absolute: 0 10*3/uL (ref 0–0.1)
Basophils Relative: 1 %
Eosinophils Absolute: 0.1 10*3/uL (ref 0–0.7)
Eosinophils Relative: 2 %
HCT: 41.4 % (ref 40.0–52.0)
HEMOGLOBIN: 13.9 g/dL (ref 13.0–18.0)
Lymphocytes Relative: 40 %
Lymphs Abs: 2.9 10*3/uL (ref 1.0–3.6)
MCH: 30.5 pg (ref 26.0–34.0)
MCHC: 33.6 g/dL (ref 32.0–36.0)
MCV: 90.5 fL (ref 80.0–100.0)
MONO ABS: 0.4 10*3/uL (ref 0.2–1.0)
MONOS PCT: 6 %
NEUTROS ABS: 3.8 10*3/uL (ref 1.4–6.5)
NEUTROS PCT: 51 %
Platelets: 255 10*3/uL (ref 150–440)
RBC: 4.57 MIL/uL (ref 4.40–5.90)
RDW: 14.3 % (ref 11.5–14.5)
WBC: 7.3 10*3/uL (ref 3.8–10.6)

## 2015-03-12 LAB — BASIC METABOLIC PANEL
Anion gap: 6 (ref 5–15)
BUN: 23 mg/dL — ABNORMAL HIGH (ref 6–20)
CALCIUM: 8.4 mg/dL — AB (ref 8.9–10.3)
CO2: 25 mmol/L (ref 22–32)
CREATININE: 0.83 mg/dL (ref 0.61–1.24)
Chloride: 104 mmol/L (ref 101–111)
GFR calc Af Amer: >60 mL/min (ref >60)
Glucose, Bld: 88 mg/dL (ref 65–99)
POTASSIUM: 3.5 mmol/L (ref 3.5–5.1)
Sodium: 135 mmol/L (ref 135–145)

## 2015-03-23 DIAGNOSIS — C8310 Mantle cell lymphoma, unspecified site: Secondary | ICD-10-CM | POA: Insufficient documentation

## 2015-03-23 NOTE — Progress Notes (Signed)
[place]  Telephone:([phone]) [phone] Fax:(336) [phone]  ID: Craig Williamson OB: October 23, 1970  MR#: [id number]  YBW#:[id number]  Patient Care Team: Denton Lank, MD as PCP - General (Family Medicine) Christene Lye, MD (General Surgery) Lloyd Huger, MD as Consulting Physician (Oncology)  CHIEF COMPLAINT:  Chief Complaint  Patient presents with  . Follow-up    lymphoma    INTERVAL HISTORY: Patient last evaluated in clinic in May 2015. He currently feels well and is asymptomatic. He remains active and is working full-time.  He has no neurologic complaints. He denies any recent fevers, night sweats, or illnesses.  He denies any weight loss.  He denies any chest pain or shortness of breath.  He has a good appetite and denies any nausea, vomiting, constipation, or diarrhea.  He has no urinary complaints.  Patient offers no specific complaints today.  REVIEW OF SYSTEMS:   Review of Systems  Constitutional: Negative for fever, chills and weight loss.  Neurological: Negative.     As per HPI. Otherwise, a complete review of systems is negatve.  PAST MEDICAL HISTORY: Past Medical History  Diagnosis Date  . Calculus of gallbladder with other cholecystitis, without mention of obstruction 2013  . Inguinal hernia without mention of obstruction or gangrene, recurrent unilateral or unspecified 2014    right  . Breast screening, unspecified 2013  . Obesity, unspecified 2013  . 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 Breast complaint 2012  . Lymphoma     PAST SURGICAL HISTORY: Past Surgical History  Procedure Laterality Date  . Hernia repair Right 2013  . Hernia repair Right 2014    recurrent RIH  . Vasectomy  11/03/2012  . Cholecystectomy  2013  . Portacath placement  2013  . Axillary lymph node biopsy Left 2012  . Bone marrow transplant  2013    FAMILY HISTORY No family history on file.     ADVANCED DIRECTIVES:     HEALTH MAINTENANCE: History  Substance Use Topics  . Smoking status: Never Smoker   . Smokeless tobacco: Not on file  . Alcohol Use: No     Colonoscopy:  PAP:  Bone density:  Lipid panel:  No Known Allergies  Current Outpatient Prescriptions  Medication Sig Dispense Refill  . calcium carbonate (OS-CAL) 600 MG TABS tablet Take 600 mg by mouth 2 (two) times daily with a meal.     No current facility-administered medications for this visit.    OBJECTIVE: Filed Vitals:   03/12/15 0932  BP: 148/94  Pulse: 76  Temp: 97.4 F (36.3 C)  Resp: 16     Body mass index is 35.2 kg/(m^2).    ECOG FS:0 - Asymptomatic  General: Well-developed, well-nourished, no acute distress. Eyes: Pink conjunctiva, anicteric sclera. Lungs: Clear to auscultation bilaterally. Heart: Regular rate and rhythm. No rubs, murmurs, or gallops. Abdomen: Soft, nontender, nondistended. No organomegaly noted, normoactive bowel sounds. Musculoskeletal: No edema, cyanosis, or clubbing. Neuro: Alert, answering all questions appropriately. Cranial nerves grossly intact. Skin: No rashes or petechiae noted. Psych: Normal affect. Lymphatics: No cervical, calvicular, axillary or inguinal LAD.   LAB RESULTS:  Lab Results  Component Value Date   NA 135 03/12/2015   K 3.5 03/12/2015   CL 104 03/12/2015   CO2 25 03/12/2015   GLUCOSE 88 03/12/2015   BUN 23* 03/12/2015   CREATININE 0.83 03/12/2015   CALCIUM 8.4* 03/12/2015   PROT 6.7 07/06/2012   ALBUMIN 3.8 07/06/2012  AST 35 07/06/2012   ALT 71 07/06/2012   ALKPHOS 74 07/06/2012   BILITOT 0.4 07/06/2012   GFRNONAA >60 03/12/2015   GFRAA >60 03/12/2015    Lab Results  Component Value Date   WBC 7.3 03/12/2015   NEUTROABS 3.8 03/12/2015   HGB 13.9 03/12/2015   HCT 41.4 03/12/2015   MCV 90.5 03/12/2015   PLT 255 03/12/2015     STUDIES: No results found.  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  PLAN:    1.  Mantle cell lymphoma: Restaging PET scan in May 2015 was reviewed independently and reported no evidence of recurrence. No intervention is needed at this time. Return to clinic in 6 months with repeat laboratory work and further evaluation. Will consider ordering reimaging at this visit if necessary.   Patient expressed understanding and was in agreement with this plan. He also understands that He can call clinic at any time with any questions, concerns, or complaints.   Mantle cell lymphoma   Staging form: Lymphoid Neoplasms, AJCC 6th Edition     Clinical stage from 03/23/2015: Stage III - Signed by Lloyd Huger, MD on 03/23/2015   Lloyd Huger, MD   03/23/2015 9:12 AM

## 2015-09-16 ENCOUNTER — Other Ambulatory Visit: Payer: No Typology Code available for payment source

## 2015-09-16 ENCOUNTER — Ambulatory Visit: Payer: No Typology Code available for payment source | Admitting: Oncology

## 2015-12-15 ENCOUNTER — Inpatient Hospital Stay: Payer: PRIVATE HEALTH INSURANCE | Attending: Oncology

## 2015-12-15 ENCOUNTER — Inpatient Hospital Stay (HOSPITAL_BASED_OUTPATIENT_CLINIC_OR_DEPARTMENT_OTHER): Payer: PRIVATE HEALTH INSURANCE | Admitting: Oncology

## 2015-12-15 VITALS — BP 122/88 | HR 82 | Temp 98.3°F | Resp 16 | Wt 194.2 lb

## 2015-12-15 DIAGNOSIS — C8310 Mantle cell lymphoma, unspecified site: Secondary | ICD-10-CM | POA: Diagnosis not present

## 2015-12-15 DIAGNOSIS — E669 Obesity, unspecified: Secondary | ICD-10-CM | POA: Diagnosis not present

## 2015-12-15 DIAGNOSIS — C8590 Non-Hodgkin lymphoma, unspecified, unspecified site: Secondary | ICD-10-CM

## 2015-12-15 DIAGNOSIS — Z9484 Stem cells transplant status: Secondary | ICD-10-CM | POA: Insufficient documentation

## 2015-12-15 DIAGNOSIS — Z79899 Other long term (current) drug therapy: Secondary | ICD-10-CM

## 2015-12-15 DIAGNOSIS — Z6834 Body mass index (BMI) 34.0-34.9, adult: Secondary | ICD-10-CM | POA: Insufficient documentation

## 2015-12-15 LAB — CBC WITH DIFFERENTIAL/PLATELET
Basophils Absolute: 0 10*3/uL (ref 0–0.1)
Basophils Relative: 0 %
EOS PCT: 2 %
Eosinophils Absolute: 0.1 10*3/uL (ref 0–0.7)
HCT: 44 % (ref 40.0–52.0)
Hemoglobin: 15.3 g/dL (ref 13.0–18.0)
LYMPHS ABS: 1.1 10*3/uL (ref 1.0–3.6)
LYMPHS PCT: 17 %
MCH: 31.2 pg (ref 26.0–34.0)
MCHC: 34.7 g/dL (ref 32.0–36.0)
MCV: 90.1 fL (ref 80.0–100.0)
Monocytes Absolute: 0.3 10*3/uL (ref 0.2–1.0)
Monocytes Relative: 4 %
Neutro Abs: 5.1 10*3/uL (ref 1.4–6.5)
Neutrophils Relative %: 77 %
PLATELETS: 175 10*3/uL (ref 150–440)
RBC: 4.89 MIL/uL (ref 4.40–5.90)
RDW: 14.5 % (ref 11.5–14.5)
WBC: 6.6 10*3/uL (ref 3.8–10.6)

## 2015-12-15 LAB — LACTATE DEHYDROGENASE: LDH: 72 U/L — ABNORMAL LOW (ref 98–192)

## 2015-12-15 NOTE — Progress Notes (Signed)
[place]  [phone] [phone]) [phone]  ID: Craig Williamson OB: Sep 22, 1970  MR#: [id number]  [id number]  Patient Care Team: Denton Lank, MD as PCP - General (Family Medicine) Christene Lye, MD (General Surgery) Lloyd Huger, MD as Consulting Physician (Oncology)  CHIEF COMPLAINT:  Chief Complaint  Patient presents with  . Lymphoma    INTERVAL HISTORY: Patient is here for 6 month followup. He currently feels well and is asymptomatic. He remains active and is working full-time.  He has no neurologic complaints. He denies any recent fevers, night sweats, or illnesses.  He denies any weight loss.  He denies any chest pain or shortness of breath.  He has a good appetite and denies any nausea, vomiting, constipation, or diarrhea.  He has no urinary complaints.  Patient offers no specific complaints today.  REVIEW OF SYSTEMS:   Review of Systems  Constitutional: Negative for fever, chills and weight loss.  Neurological: Negative.     As per HPI. Otherwise, a complete review of systems is negatve.  PAST MEDICAL HISTORY: Past Medical History  Diagnosis Date  . Calculus of gallbladder with other cholecystitis, without mention of obstruction 2013  . Inguinal hernia without mention of obstruction or gangrene, recurrent unilateral or unspecified 2014    right  . Breast screening, unspecified 2013  . Obesity, unspecified 2013  . 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 Breast complaint 2012  . Lymphoma     PAST SURGICAL HISTORY: Past Surgical History  Procedure Laterality Date  . Hernia repair Right 2013  . Hernia repair Right 2014    recurrent RIH  . Vasectomy  11/03/2012  . Cholecystectomy  2013  . Portacath placement  2013  . Axillary lymph node biopsy Left 2012  . Bone marrow transplant  2013    FAMILY HISTORY: Reviewed and unchanged. No reported history of malignancy or chronic  disease.     ADVANCED DIRECTIVES:    HEALTH MAINTENANCE: Social History  Substance Use Topics  . Smoking status: Never Smoker   . Smokeless tobacco: Not on file  . Alcohol Use: No     No Known Allergies  No current outpatient prescriptions on file.   No current facility-administered medications for this visit.    OBJECTIVE: Filed Vitals:   12/15/15 0953  BP: 122/88  Pulse: 82  Temp: 98.3 F (36.8 C)  Resp: 16     Body mass index is 34.41 kg/(m^2).    ECOG FS:0 - Asymptomatic  General: Well-developed, well-nourished, no acute distress. Eyes: Pink conjunctiva, anicteric sclera. Lungs: Clear to auscultation bilaterally. Heart: Regular rate and rhythm. No rubs, murmurs, or gallops. Abdomen: Soft, nontender, nondistended. No organomegaly noted, normoactive bowel sounds. Musculoskeletal: No edema, cyanosis, or clubbing. Neuro: Alert, answering all questions appropriately. Cranial nerves grossly intact. Skin: No rashes or petechiae noted. Psych: Normal affect. Lymphatics: No cervical, calvicular, axillary or LAD.   LAB RESULTS:  Lab Results  Component Value Date   NA 135 03/12/2015   K 3.5 03/12/2015   CL 104 03/12/2015   CO2 25 03/12/2015   GLUCOSE 88 03/12/2015   BUN 23* 03/12/2015   CREATININE 0.83 03/12/2015   CALCIUM 8.4* 03/12/2015   PROT 6.7 07/06/2012   ALBUMIN 3.8 07/06/2012   AST 35 07/06/2012   ALT 71 07/06/2012   ALKPHOS 74 07/06/2012   BILITOT 0.4 07/06/2012   GFRNONAA >60 03/12/2015   GFRAA >60 03/12/2015    Lab Results  Component Value Date   WBC 6.6 12/15/2015   NEUTROABS 5.1 12/15/2015   HGB 15.3 12/15/2015   HCT 44.0 12/15/2015   MCV 90.1 12/15/2015   PLT 175 12/15/2015     STUDIES: No results found.  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  PLAN:    1.  Mantle cell lymphoma: No clinical signs of recurrence therefore no intervention is needed at this time.  Return to clinic in 1 year with repeat laboratory work and further evaluation. Probably discharge at next visit if all is well.    Patient expressed understanding and was in agreement with this plan. He also understands that He can call clinic at any time with any questions, concerns, or complaints.   Mantle cell lymphoma   Staging form: Lymphoid Neoplasms, AJCC 6th Edition     Clinical stage from 03/23/2015: Stage III - Signed by Lloyd Huger, MD on 03/23/2015   Mayra Reel, NP   12/15/2015 10:07 AM  Patient was seen and evaluated independently and I agree with the assessment and plan as dictated above.  Lloyd Huger, MD 12/15/2015 11:21 AM

## 2015-12-15 NOTE — Progress Notes (Signed)
Patient does not offer any problems today.  

## 2016-12-14 ENCOUNTER — Inpatient Hospital Stay: Payer: PRIVATE HEALTH INSURANCE | Admitting: Oncology

## 2016-12-14 ENCOUNTER — Inpatient Hospital Stay: Payer: PRIVATE HEALTH INSURANCE

## 2023-07-05 ENCOUNTER — Inpatient Hospital Stay: Payer: BC Managed Care – PPO | Attending: Oncology | Admitting: Oncology

## 2023-07-05 ENCOUNTER — Inpatient Hospital Stay: Payer: BC Managed Care – PPO

## 2023-07-05 ENCOUNTER — Encounter: Payer: Self-pay | Admitting: Oncology

## 2023-07-05 VITALS — BP 167/103 | HR 81 | Temp 97.9°F | Resp 16 | Ht 63.0 in | Wt 211.8 lb

## 2023-07-05 DIAGNOSIS — C8310 Mantle cell lymphoma, unspecified site: Secondary | ICD-10-CM | POA: Diagnosis not present

## 2023-07-05 DIAGNOSIS — I10 Essential (primary) hypertension: Secondary | ICD-10-CM | POA: Insufficient documentation

## 2023-07-05 DIAGNOSIS — Z79899 Other long term (current) drug therapy: Secondary | ICD-10-CM | POA: Insufficient documentation

## 2023-07-05 DIAGNOSIS — Z9484 Stem cells transplant status: Secondary | ICD-10-CM | POA: Insufficient documentation

## 2023-07-05 DIAGNOSIS — Z8572 Personal history of non-Hodgkin lymphomas: Secondary | ICD-10-CM | POA: Diagnosis present

## 2023-07-05 LAB — CBC WITH DIFFERENTIAL/PLATELET
Abs Immature Granulocytes: 0.02 10*3/uL (ref 0.00–0.07)
Basophils Absolute: 0 10*3/uL (ref 0.0–0.1)
Basophils Relative: 1 %
Eosinophils Absolute: 0.2 10*3/uL (ref 0.0–0.5)
Eosinophils Relative: 3 %
HCT: 40.3 % (ref 39.0–52.0)
Hemoglobin: 13.6 g/dL (ref 13.0–17.0)
Immature Granulocytes: 0 %
Lymphocytes Relative: 51 %
Lymphs Abs: 3.4 10*3/uL (ref 0.7–4.0)
MCH: 30.4 pg (ref 26.0–34.0)
MCHC: 33.7 g/dL (ref 30.0–36.0)
MCV: 90 fL (ref 80.0–100.0)
Monocytes Absolute: 0.5 10*3/uL (ref 0.1–1.0)
Monocytes Relative: 7 %
Neutro Abs: 2.6 10*3/uL (ref 1.7–7.7)
Neutrophils Relative %: 38 %
Platelets: 222 10*3/uL (ref 150–400)
RBC: 4.48 MIL/uL (ref 4.22–5.81)
RDW: 13.2 % (ref 11.5–15.5)
WBC: 6.7 10*3/uL (ref 4.0–10.5)
nRBC: 0 % (ref 0.0–0.2)

## 2023-07-05 LAB — LACTATE DEHYDROGENASE: LDH: 139 U/L (ref 98–192)

## 2023-07-05 NOTE — Progress Notes (Signed)
[facility]  Telephone:(336) [phone] [phone]) [phone]  ID: Joseph Berkshire OB: [birth year]  MR#: [id number]  [id number]#:[id number]  Patient Care Team: Hillery Aldo, MD as PCP - General (Family Medicine) Kieth Brightly, MD (General Surgery) Jeralyn Ruths, MD as Consulting Physician (Oncology)  CHIEF COMPLAINT: History of stage III mantle cell lymphoma status post autologous stem cell transplant in September 2013.  INTERVAL HISTORY: Patient was last evaluated in clinic by nurse practitioner in April 2017.  He was referred back with a mildly elevated lymphocyte count and for further evaluation.  He currently feels well and is asymptomatic.  He has no neurologic complaints.  He denies any recent fevers or illnesses.  He has good appetite and denies weight loss.  He has no chest pain, shortness of breath, cough, or hemoptysis.  He denies any nausea, vomiting, constipation, or diarrhea.  He has no urinary complaints.  Patient feels at his baseline and offers no specific complaints today.  REVIEW OF SYSTEMS:   Review of Systems  Constitutional: Negative.  Negative for fever, malaise/fatigue and weight loss.  Respiratory: Negative.  Negative for cough, hemoptysis and shortness of breath.   Cardiovascular: Negative.  Negative for chest pain and leg swelling.  Gastrointestinal: Negative.  Negative for abdominal pain.  Genitourinary: Negative.  Negative for dysuria.  Musculoskeletal: Negative.  Negative for back pain.  Skin: Negative.  Negative for rash.  Neurological: Negative.  Negative for dizziness, focal weakness, weakness and headaches.  Psychiatric/Behavioral: Negative.  The patient is not nervous/anxious.     As per HPI. Otherwise, a complete review of systems is negative.  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 No pertinent family history.  ADVANCED DIRECTIVES (Y/N):  N  HEALTH MAINTENANCE: Social History   Tobacco Use   Smoking status: Never  Substance Use Topics   Alcohol use: No   Drug use: No     Colonoscopy:  PAP:  Bone density:  Lipid panel:  No Known Allergies  Current Outpatient Medications  Medication Sig Dispense Refill   LIPITOR 40 MG tablet Take 40 mg by mouth daily.     No current facility-administered medications for this visit.    OBJECTIVE: Vitals:   07/05/23 1104  BP: (!) 167/103  Pulse: 81  Resp: 16  Temp: 97.9 F (36.6 C)  SpO2: 100%     Body mass index is 37.52 kg/m.    ECOG FS:0 - Asymptomatic  General: Well-developed, well-nourished, no acute distress. Eyes: Pink conjunctiva, anicteric sclera. HEENT: Normocephalic, moist mucous membranes. Lungs: No audible wheezing or coughing. Heart: Regular rate and rhythm. Abdomen: Soft, nontender, no obvious distention. Musculoskeletal: No edema, cyanosis, or clubbing. Neuro: Alert, answering all questions  appropriately. Cranial nerves grossly intact. Skin: No rashes or petechiae noted. Psych: Normal affect. Lymphatics: No cervical, calvicular, axillary or inguinal LAD.   LAB RESULTS:  Lab Results  Component Value Date   NA 135 03/12/2015   K 3.5 03/12/2015   CL 104  03/12/2015   CO2 25 03/12/2015   GLUCOSE 88 03/12/2015   BUN 23 (H) 03/12/2015   CREATININE 0.83 03/12/2015   CALCIUM 8.4 (L) 03/12/2015   PROT 6.7 07/06/2012   ALBUMIN 3.8 07/06/2012   AST 35 07/06/2012   ALT 71 07/06/2012   ALKPHOS 74 07/06/2012   BILITOT 0.4 07/06/2012   GFRNONAA >60 03/12/2015   GFRAA >60 03/12/2015    Lab Results  Component Value Date   WBC 6.7 07/05/2023   NEUTROABS 2.6 07/05/2023   HGB 13.6 07/05/2023   HCT 40.3 07/05/2023   MCV 90.0 07/05/2023   PLT 222 07/05/2023     STUDIES: No results found.  ASSESSMENT: History of stage III mantle cell lymphoma status post autologous stem cell transplant in September 2013  PLAN:    Mantle cell lymphoma: No apparent evidence of disease.  Patient last had imaging with PET scan on Jan 15, 2014 that revealed no evidence of disease.  We discussed the possibility of repeating imaging with CT scan and patient declined secondary to financial constraints.  He did agree to imaging if he became symptomatic or his laboratory work was abnormal.  CBC with differential is completely within normal limits.  Peripheral blood flow cytometry was ordered for completeness and is pending at time of dictation. Hypertension: Patient's blood pressure is moderately elevated today.  Continue monitoring and treatment per primary care. Disposition: No further follow-up is necessary.  Will call patient with results of peripheral blood flow cytometry.  Please refer patient back if there are any questions or concerns.  I spent a total of 45 minutes reviewing chart data, face-to-face evaluation with the patient, counseling and coordination of care as detailed above.   Patient expressed understanding and was in agreement with this plan. He also understands that He can call clinic at any time with any questions, concerns, or complaints.    Cancer Staging  Mantle cell lymphoma [facility]) Staging form: Lymphoid Neoplasms, AJCC 6th Edition - Clinical  stage from 03/23/2015: Stage III - Signed by Jeralyn Ruths, MD on 03/23/2015   Jeralyn Ruths, MD   07/05/2023 1:27 PM  Addendum: Peripheral blood flow cytometry noted approximately 13% large granular lymphocytes.  This can be associated with a variety of disease processes including rheumatoid arthritis, other autoimmune disorders and various hemopoietic neoplasms.  No intervention is needed.  No follow-up is scheduled.  Will repeat peripheral blood flow cytometry in 6 months to assess for any interval changes.   Jeralyn Ruths, MD 07/07/23 4:46 PM

## 2023-07-07 LAB — COMP PANEL: LEUKEMIA/LYMPHOMA

## 2024-01-04 ENCOUNTER — Other Ambulatory Visit: Payer: Self-pay | Admitting: *Deleted

## 2024-01-04 DIAGNOSIS — C8310 Mantle cell lymphoma, unspecified site: Secondary | ICD-10-CM

## 2024-01-05 ENCOUNTER — Inpatient Hospital Stay: Payer: BC Managed Care – PPO | Attending: Oncology

## 2024-01-05 DIAGNOSIS — Z8579 Personal history of other malignant neoplasms of lymphoid, hematopoietic and related tissues: Secondary | ICD-10-CM | POA: Diagnosis present

## 2024-01-05 DIAGNOSIS — C8310 Mantle cell lymphoma, unspecified site: Secondary | ICD-10-CM

## 2024-01-05 LAB — CBC WITH DIFFERENTIAL/PLATELET
Abs Immature Granulocytes: 0.02 10*3/uL (ref 0.00–0.07)
Basophils Absolute: 0 10*3/uL (ref 0.0–0.1)
Basophils Relative: 1 %
Eosinophils Absolute: 0.3 10*3/uL (ref 0.0–0.5)
Eosinophils Relative: 5 %
HCT: 40.1 % (ref 39.0–52.0)
Hemoglobin: 13.9 g/dL (ref 13.0–17.0)
Immature Granulocytes: 0 %
Lymphocytes Relative: 50 %
Lymphs Abs: 3.5 10*3/uL (ref 0.7–4.0)
MCH: 30.8 pg (ref 26.0–34.0)
MCHC: 34.7 g/dL (ref 30.0–36.0)
MCV: 88.7 fL (ref 80.0–100.0)
Monocytes Absolute: 0.4 10*3/uL (ref 0.1–1.0)
Monocytes Relative: 6 %
Neutro Abs: 2.7 10*3/uL (ref 1.7–7.7)
Neutrophils Relative %: 38 %
Platelets: 218 10*3/uL (ref 150–400)
RBC: 4.52 MIL/uL (ref 4.22–5.81)
RDW: 13.1 % (ref 11.5–15.5)
WBC: 6.9 10*3/uL (ref 4.0–10.5)
nRBC: 0 % (ref 0.0–0.2)

## 2024-01-05 LAB — LACTATE DEHYDROGENASE: LDH: 141 U/L (ref 98–192)

## 2024-01-12 ENCOUNTER — Inpatient Hospital Stay: Payer: BC Managed Care – PPO | Admitting: Oncology

## 2024-01-12 ENCOUNTER — Encounter: Payer: Self-pay | Admitting: Oncology

## 2024-01-12 VITALS — BP 137/90 | HR 98 | Temp 99.2°F | Resp 16 | Ht 63.0 in | Wt 214.3 lb

## 2024-01-12 DIAGNOSIS — Z8579 Personal history of other malignant neoplasms of lymphoid, hematopoietic and related tissues: Secondary | ICD-10-CM | POA: Diagnosis not present

## 2024-01-12 DIAGNOSIS — K4091 Unilateral inguinal hernia, without obstruction or gangrene, recurrent: Secondary | ICD-10-CM

## 2024-01-12 DIAGNOSIS — C8310 Mantle cell lymphoma, unspecified site: Secondary | ICD-10-CM | POA: Diagnosis not present

## 2024-01-12 LAB — COMP PANEL: LEUKEMIA/LYMPHOMA

## 2024-01-12 NOTE — Progress Notes (Signed)
 C/o foot pain on the sole x2 days, 4/10, painful to walk. Asking for rx for the pain.  Over 10 years ago had hernia surgery, doesn't feel surgery went well. He can still feel a lump. He would like your opinion on how to proceed.

## 2024-01-12 NOTE — Progress Notes (Signed)
 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  INTERVAL HISTORY: Patient returns clinic today for repeat laboratory work and routine 4-month evaluation.  He has increased foot pain secondary to "walking too much at work".  He also complains of a worsening hernia.  He otherwise feels well. He has no neurologic complaints.  He denies any recent fevers or illnesses.  He has good appetite and denies weight loss.  He has no chest pain, shortness of breath, cough, or hemoptysis.  He denies any nausea, vomiting, constipation, or diarrhea.  He has no urinary complaints.  Patient offers no further specific complaints today.  REVIEW OF SYSTEMS:   Review of Systems  Constitutional: Negative.  Negative for fever, malaise/fatigue and weight loss.  Respiratory: Negative.  Negative for cough, hemoptysis and shortness of breath.   Cardiovascular: Negative.  Negative for chest pain and leg swelling.  Gastrointestinal: Negative.  Negative for abdominal pain.  Genitourinary: Negative.  Negative for dysuria.  Musculoskeletal: Negative.  Negative for back pain.  Skin: Negative.  Negative for rash.  Neurological: Negative.  Negative for dizziness, focal weakness, weakness and headaches.  Psychiatric/Behavioral: Negative.  The patient is not nervous/anxious.     As per HPI. Otherwise, a complete review of systems is negative.  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 No pertinent family history.  ADVANCED DIRECTIVES (Y/N):  N  HEALTH MAINTENANCE: Social History   Tobacco Use   Smoking status: Never  Substance Use Topics   Alcohol use: No   Drug use: No     Colonoscopy:  PAP:  Bone density:  Lipid panel:  No Known Allergies  Current Outpatient Medications  Medication Sig Dispense Refill   LIPITOR 40 MG tablet Take 40 mg by mouth daily.     No current facility-administered medications for this visit.    OBJECTIVE: Vitals:   01/12/24 1035 01/12/24 1053  BP: (!) 140/93 (!) 137/90  Pulse: 98   Resp: 16   Temp: 99.2 F (37.3 C)   SpO2: 100%      Body mass index is 37.96 kg/m.    ECOG FS:0 - Asymptomatic  General: Well-developed, well-nourished, no acute distress. Eyes: Pink conjunctiva, anicteric sclera. HEENT: Normocephalic, moist mucous membranes. Lungs: No audible wheezing or coughing. Heart: Regular rate and rhythm. Abdomen: Soft, nontender, no obvious distention. Musculoskeletal: No edema, cyanosis, or clubbing. Neuro: Alert, answering all questions appropriately. Cranial nerves grossly intact. Skin: No rashes or  petechiae noted. Psych: Normal affect.  LAB RESULTS:  Lab Results  Component Value Date   NA 135 03/12/2015   K 3.5 03/12/2015   CL 104 03/12/2015   CO2 25 03/12/2015   GLUCOSE 88 03/12/2015   BUN 23 (H) 03/12/2015   CREATININE 0.83 03/12/2015   CALCIUM 8.4 (L)  03/12/2015   PROT 6.7 07/06/2012   ALBUMIN 3.8 07/06/2012   AST 35 07/06/2012   ALT 71 07/06/2012   ALKPHOS 74 07/06/2012   BILITOT 0.4 07/06/2012   GFRNONAA >60 03/12/2015   GFRAA >60 03/12/2015    Lab Results  Component Value Date   WBC 6.9 01/05/2024   NEUTROABS 2.7 01/05/2024   HGB 13.9 01/05/2024   HCT 40.1 01/05/2024   MCV 88.7 01/05/2024   PLT 218 01/05/2024     STUDIES: No results found.  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  PLAN:    Large granular lymphocytes: Incidentally noted on peripheral blood flow cytometry and likely clinically insignificant.  Previously LGL cells are present in 13% sample, his most recent result was only 10%.  No intervention is needed.  Return to clinic in 1 year with repeat laboratory work and further evaluation. History of Mantle cell lymphoma: Patient is status post chemotherapy and bone marrow transplant greater than 10 years ago.  No apparent evidence of disease.  Patient last had imaging with PET scan on Jan 15, 2014 that revealed no evidence of disease.  No intervention is needed.   Hypertension: Chronic and unchanged.  Continue monitoring and treatment per primary care. Foot pain: We discussed the possibility of referral to podiatry which patient declined. Hernia: Patient underwent surgery approximately 1 decade ago.  Surgeon is retired, therefore patient was given a referral to general surgery for further evaluation.   Patient expressed understanding and was in agreement with this plan. He also understands that He can call clinic at any time with any questions, concerns, or complaints.    Cancer Staging  Mantle cell lymphoma [facility]) Staging form: Lymphoid Neoplasms, AJCC 6th Edition - Clinical stage from 03/23/2015: Stage III - Signed by Shellie Dials, MD on 03/23/2015   Shellie Dials, MD   01/12/2024 4:16 PM  Addendum: Peripheral blood flow cytometry noted approximately 13% large granular  lymphocytes.  This can be associated with a variety of disease processes including rheumatoid arthritis, other autoimmune disorders and various hemopoietic neoplasms.  No intervention is needed.  No follow-up is scheduled.  Will repeat peripheral blood flow cytometry in 6 months to assess for any interval changes.   Shellie Dials, MD 01/12/24 4:16 PM

## 2024-01-23 ENCOUNTER — Encounter: Payer: Self-pay | Admitting: Surgery

## 2024-01-23 ENCOUNTER — Ambulatory Visit: Payer: PRIVATE HEALTH INSURANCE | Admitting: Surgery

## 2024-01-23 VITALS — BP 122/74 | HR 79 | Temp 98.1°F | Wt 213.2 lb

## 2024-01-23 DIAGNOSIS — R1031 Right lower quadrant pain: Secondary | ICD-10-CM | POA: Diagnosis not present

## 2024-01-23 NOTE — Patient Instructions (Addendum)
 Le hemos programado una tomografa computarizada de abdomen y pelvis con contraste. Acupuncturist se program en [facility] el 06/28/2024. Por favor, llegue antes de las 6:45 a. m. para una cita a las 9:00 a. m. Si necesita reprogramar su tomografa, puede hacerlo llamando al [phone]. Por favor, avsenos si reprograma su tomografa, ya que necesitamos la autorizacin de su seguro para ello.   Hernia en la ingle (hernia inguinal) en adultos: Qu debe saber Groin Hernia (Inguinal Hernia) in Adults: What to Know  Una hernia ocurre cuando un rgano o un tejido del cuerpo protruye a travs de un punto dbil en los msculos del vientre. Esto forma un bulto. Una hernia en la ingle tambin se denomina hernia inguinal. Se encuentra en la ingle, que es la zona en la que se unen las piernas con la parte inferior del vientre. Este tipo de hernia tambin puede aparecer en: El escroto, si es varn. Los pliegues de la piel alrededor de la vagina, si es Gibbs. Es posible que pueda empujar el bulto nuevamente hacia adentro del vientre. Si no puede empujarlo hacia adentro y se interrumpe el flujo sanguneo a la hernia, necesitar una ciruga de inmediato. Cules son las causas? Una hernia en la ingle puede ocurrir cuando ejerce tensin en los msculos del vientre, por ejemplo, cuando hace lo siguiente: Levanta un objeto pesado. Hace esfuerzo para defecar. Tose. Qu incrementa el riesgo? Es ms probable que le salga una hernia en la ingle si: Es hombre. Tiene 50 aos o ms. Est embarazada. Ha tenido una hernia en la ingle o una ciruga en el vientre anteriormente. Fuma. Tiene sobrepeso. Tiene un trabajo en el que necesita estar mucho tiempo de pie o levantar objetos pesados. Cules son los signos o sntomas? Los sntomas pueden depender de lo grande que sea la hernia. Si es pequea, es posible que no tenga sntomas. Si es ms grande, puede tener lo siguiente: Un bulto cerca de la ingle o de los  genitales. Dolor o ardor en la ingle. Un dolor sordo o sensacin de presin en la ingle. Si se interrumpe el flujo sanguneo a los tejidos dentro de la hernia, tambin puede: Sentir dolor y sensibilidad al tocar el bulto. La piel sobre el bulto puede tornarse [place]. Tener fiebre. Vomitar o sentir ganas de vomitar. Tener dificultad para defecar o eliminar gases. Cmo se trata? El tratamiento depende del tamao de la hernia y de los sntomas que tenga. Puede ser necesario: Que lo controlen para ver si el bulto se agranda. Someterse a [place]. Esta puede hacerse si la hernia es grande o si tiene sntomas. Siga estas indicaciones en su casa: Estilo de vida Pregunte si puede levantar objetos. Intente no estar de pie durante largos perodos de [place]. No fume, vapee ni consuma nicotina o tabaco. Mantenga un peso saludable. Trate de no hacer cosas que ejerzan presin sobre la hernia. Cmo evitar problemas para defecar Es posible que tenga que tomar estas medidas para ayudar a tratar o prevenir la dificultad para defecar (estreimiento): Tomar medicamentos que le ayuden a Advertising copywriter. Consumir alimentos ricos en fibra, como legumbres, cereales integrales, y frutas y verduras frescas. Beber ms lquido segn se lo hayan indicado. Indicaciones generales Intente colocar la hernia en su lugar ejerciendo sobre esta una presin muy suave mientras est acostado. No intente forzarla hacia adentro nuevamente si esta no entra fcilmente. Vigile la hernia para detectar cualquier cambio de: Forma. Tamao. Color. Use los medicamentos nicamente segn las indicaciones.  Comunquese con un mdico si: Tiene fiebre. Aparecen nuevos sntomas. Los sntomas empeoran. No puede defecar o expulsar gases. Solicite ayuda de inmediato si: El bulto: Comienza a Engineer, petroleum. Cambia de color. Tiene un dolor repentino en el escroto o el escroto cambia de tamao. Usted no puede empujar suavemente la  hernia y volver a Engineer, drilling. Siente ganas de vomitar y la sensacin no desaparece. Sigue vomitando o con ganas de vomitar. Estos sntomas pueden Customer service manager. Llame al 911 de inmediato. No espere a ver si los sntomas desaparecen. No conduzca por sus propios medios [employer]. Esta informacin no tiene Theme park manager el consejo del mdico. Asegrese de hacerle al mdico cualquier pregunta que tenga. Document Revised: 05/06/2023 Document Reviewed: 05/06/2023 Elsevier Patient Education  2024 [employer].

## 2024-01-23 NOTE — Progress Notes (Signed)
 Patient ID: Craig Williamson, male   DOB: Aug 28, 1970, 53 y.o.   MRN: [id number]  HPI Craig Williamson is a 53 y.o. male hx mantle cell lymphoma. Seen in consultation for Right inguinal bulge.  Prior cholecystectomy 2013 and prior Right INg hernia repair 2013 and recurred in 2014. I have reviewed op reports.  He endorses intermittent discomfort and pain that is mild located in the right scrotal area.  He says that when he is constipated and he defecates he feels that this mass hardens similar to an egg consistency. No specific alleviating or aggravating factors. He is very active and is able to perform more than 4 METS of activity without any chest pain or shortness of breath.  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  He is still working. 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     No current facility-administered medications for this visit.     Review of  Systems Full ROS  was asked and was negative except for the information on the HPI  Physical Exam There were no vitals taken for this visit. CONSTITUTIONAL: NAD. EYES: Pupils are equal, round, and reactive to light, Sclera are non-icteric. EARS, NOSE, MOUTH AND THROAT: The oropharynx is clear. The oral mucosa is pink and moist. Hearing is intact to voice. LYMPH NODES:  Lymph nodes in the neck are normal. RESPIRATORY:  Lungs are clear. There is normal respiratory effort, with equal breath sounds bilaterally, and without pathologic use of accessory muscles. CARDIOVASCULAR: Heart is regular without murmurs, gallops, or rubs. GI: The abdomen is  soft, nontender, and nondistended. There are no palpable masses. There is no hepatosplenomegaly. There are normal bowel sounds in all quadrants.  GU: Normal testicles w/o masses, normal penis and meatus, evidence of  soft 3 x 3 cm mass involving the spermatic cord I cannot palpate a definitive inguinal hernia defect MUSCULOSKELETAL: Normal muscle strength and tone. No cyanosis or edema.   SKIN: Turgor is good and there are no pathologic skin lesions or ulcers. NEUROLOGIC: Motor and sensation is grossly normal. Cranial nerves are grossly intact. PSYCH:  Oriented to person, place and time. Affect is normal.  Data Reviewed I have personally reviewed the patient's imaging, laboratory findings and medical records.    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  On exam I do not feel a hernia but the exam is limited due to discomfort and body habitus.  I would like to obtain a CT scan of the abdomen and pelvis in a prone position to increase the sensitivity of the CT scan.  Depending on findings may require surgical intervention versus observation and.  Discussed with him in detail. A copy of this report sent to the referring  provider.  Please note that I spent 45 minutes in this encounter including review of medical records, review of imaging studies personally ,  Placing orders and performing documentation   Evelia Hipp, MD FACS General Surgeon 01/23/2024, 1:46 PM

## 2024-02-01 ENCOUNTER — Ambulatory Visit
Admission: RE | Admit: 2024-02-01 | Discharge: 2024-02-01 | Disposition: A | Discharge status: Home / Self Care | Source: Ambulatory Visit | Attending: Surgery | Admitting: Surgery

## 2024-02-01 DIAGNOSIS — R1031 Right lower quadrant pain: Secondary | ICD-10-CM | POA: Insufficient documentation

## 2024-02-01 MED ORDER — IOHEXOL 300 MG/ML  SOLN
100.0000 mL | Freq: Once | INTRAMUSCULAR | Status: AC | PRN
  Administered 2024-02-01: 100 mL via INTRAVENOUS

## 2024-02-06 ENCOUNTER — Ambulatory Visit: Admitting: Surgery

## 2024-02-06 ENCOUNTER — Encounter: Payer: Self-pay | Admitting: Surgery

## 2024-02-06 ENCOUNTER — Telehealth: Payer: Self-pay | Admitting: Surgery

## 2024-02-06 VITALS — BP 145/77 | HR 77 | Ht 63.0 in | Wt 212.0 lb

## 2024-02-06 DIAGNOSIS — N433 Hydrocele, unspecified: Secondary | ICD-10-CM

## 2024-02-06 DIAGNOSIS — K4091 Unilateral inguinal hernia, without obstruction or gangrene, recurrent: Secondary | ICD-10-CM

## 2024-02-06 DIAGNOSIS — R1031 Right lower quadrant pain: Secondary | ICD-10-CM

## 2024-02-06 NOTE — Telephone Encounter (Signed)
 Patient has been advised of Pre-Admission date/time, and Surgery date at [facility].  Surgery Date: 02/21/24 Preadmission Testing Date: 02/13/24 (phone 8a-1p)  Patient informed of the scheduling process and surgery information given at time of office visit.  Patient has been made aware to call [phone], between 1-3:00pm the day before surgery, to find out what time to arrive for surgery.

## 2024-02-06 NOTE — Patient Instructions (Addendum)
 We have sent a referral to Dr Cherylene Corrente of [facility] for you Hydrocele. He may need to see you prior to surgery.    You have chose to have your hernia repaired. This will be done by Dr. Dana Duncan at [facility]  Please see your (blue) Pre-care information that you have been given today. Our surgery scheduler will call you to verify surgery date and to go over information.   You will need to arrange to be out of work for approximately 1-2 weeks and then you may return with a lifting restriction for 4 more weeks. If you have FMLA or Disability paperwork that needs to be filled out, please have your company fax your paperwork to [phone] or you may drop this by either office. This paperwork will be filled out within 3 days after your surgery has been completed.  You may have a bruise in your groin and also swelling and brusing in your testicle area. You may use ice 4-5 times daily for 15-20 minutes each time. Make sure that you place a barrier between you and the ice pack. To decrease the swelling, you may roll up a bath towel and place it vertically in between your thighs with your testicles resting on the towel. You will want to keep this area elevated as much as possible for several days following surgery.    Inguinal Hernia, Adult Muscles help keep everything in the body in its proper place. But if a weak spot in the muscles develops, something can poke through. That is called a hernia. When this happens in the lower part of the belly (abdomen), it is called an inguinal hernia. (It takes its name from a part of the body in this region called the inguinal canal.) A weak spot in the wall of muscles lets some fat or part of the small intestine bulge through. An inguinal hernia can develop at any age. Men get them more often than women. CAUSES  In adults, an inguinal hernia develops over time. It can be triggered by: Suddenly straining the muscles of the lower abdomen. Lifting heavy  objects. Straining to have a bowel movement. Difficult bowel movements (constipation) can lead to this. Constant coughing. This may be caused by smoking or lung disease. Being overweight. Being pregnant. Working at a job that requires long periods of standing or heavy lifting. Having had an inguinal hernia before. One type can be an emergency situation. It is called a strangulated inguinal hernia. It develops if part of the small intestine slips through the weak spot and cannot get back into the abdomen. The blood supply can be cut off. If that happens, part of the intestine may die. This situation requires emergency surgery. SYMPTOMS  Often, a small inguinal hernia has no symptoms. It is found when a healthcare provider does a physical exam. Larger hernias usually have symptoms.  In adults, symptoms may include: A lump in the groin. This is easier to see when the person is standing. It might disappear when lying down. In men, a lump in the scrotum. Pain or burning in the groin. This occurs especially when lifting, straining or coughing. A dull ache or feeling of pressure in the groin. Signs of a strangulated hernia can include: A bulge in the groin that becomes very painful and tender to the touch. A bulge that turns red or purple. Fever, nausea and vomiting. Inability to have a bowel movement or to pass gas. DIAGNOSIS  To decide if you have an  inguinal hernia, a healthcare provider will probably do a physical examination. This will include asking questions about any symptoms you have noticed. The healthcare provider might feel the groin area and ask you to cough. If an inguinal hernia is felt, the healthcare provider may try to slide it back into the abdomen. Usually no other tests are needed. TREATMENT  Treatments can vary. The size of the hernia makes a difference. Options include: Watchful waiting. This is often suggested if the hernia is small and you have had no symptoms. No  medical procedure will be done unless symptoms develop. You will need to watch closely for symptoms. If any occur, contact your healthcare provider right away. Surgery. This is used if the hernia is larger or you have symptoms. Open surgery. This is usually an outpatient procedure (you will not stay overnight in a hospital). An cut (incision) is made through the skin in the groin. The hernia is put back inside the abdomen. The weak area in the muscles is then repaired by herniorrhaphy or hernioplasty. Herniorrhaphy: in this type of surgery, the weak muscles are sewn back together. Hernioplasty: a patch or mesh is used to close the weak area in the abdominal wall. Laparoscopy. In this procedure, a surgeon makes small incisions. A thin tube with a tiny video camera (called a laparoscope) is put into the abdomen. The surgeon repairs the hernia with mesh by looking with the video camera and using two long instruments. HOME CARE INSTRUCTIONS  After surgery to repair an inguinal hernia: You will need to take pain medicine prescribed by your healthcare provider. Follow all directions carefully. You will need to take care of the wound from the incision. Your activity will be restricted for awhile. This will probably include no heavy lifting for several weeks. You also should not do anything too active for a few weeks. When you can return to work will depend on the type of job that you have. During watchful waiting periods, you should: Maintain a healthy weight. Eat a diet high in fiber (fruits, vegetables and whole grains). Drink plenty of fluids to avoid constipation. This means drinking enough water and other liquids to keep your urine clear or pale yellow. Do not lift heavy objects. Do not stand for long periods of time. Quit smoking. This should keep you from developing a frequent cough. SEEK MEDICAL CARE IF:  A bulge develops in your groin area. You feel pain, a burning sensation or pressure in the  groin. This might be worse if you are lifting or straining. You develop a fever of more than 100.5 F (38.1 C). SEEK IMMEDIATE MEDICAL CARE IF:  Pain in the groin increases suddenly. A bulge in the groin gets bigger suddenly and does not go down. For men, there is sudden pain in the scrotum. Or, the size of the scrotum increases. A bulge in the groin area becomes red or purple and is painful to touch. You have nausea or vomiting that does not go away. You feel your heart beating much faster than normal. You cannot have a bowel movement or pass gas. You develop a fever of more than 102.0 F (38.9 C).   This information is not intended to replace advice given to you by your health care provider. Make sure you discuss any questions you have with your health care provider.   Document Released: 12/26/2008 Document Revised: 11/01/2011 Document Reviewed: 02/10/2015 Elsevier Interactive Patient Education [employer]

## 2024-02-08 ENCOUNTER — Encounter: Payer: Self-pay | Admitting: Urology

## 2024-02-08 ENCOUNTER — Ambulatory Visit: Admitting: Urology

## 2024-02-08 ENCOUNTER — Ambulatory Visit
Admission: RE | Admit: 2024-02-08 | Discharge: 2024-02-08 | Disposition: A | Discharge status: Home / Self Care | Source: Ambulatory Visit | Attending: Urology | Admitting: Urology

## 2024-02-08 VITALS — BP 134/84 | HR 78 | Ht 65.0 in | Wt 212.0 lb

## 2024-02-08 DIAGNOSIS — N5089 Other specified disorders of the male genital organs: Secondary | ICD-10-CM | POA: Diagnosis present

## 2024-02-08 NOTE — Progress Notes (Signed)
 I, Maysun Jamey Mccallum, acting as a scribe for Geraline Knapp, MD., have documented all relevant documentation on the behalf of Geraline Knapp, MD, as directed by Geraline Knapp, MD while in the presence of Geraline Knapp, MD.  02/08/2024 3:35 PM   Fredie Jarvis 1971-03-14 [id number]  Referring provider: Comer Decamp, MD [address] [place],  [place] [address]  Chief Complaint  Patient presents with   Hydrocele    HPI: Craig Williamson is a 53 y.o. male referred for evaluation of a right hydrocele. His daughter was with him today, and a Spanish interpreter, and a [employer] Spanish interpreter.   Saw Dr Dana Duncan 01/23/2024 for rght inguinal pain. Complained of right upper scrotal/inguinal pain with increased size of a mass with bladder filling. He noted the mass hardened inconsistency, then would improve after completing a bowel movement or voiding.  History of inguinal hernia repairs and a right inguinal hernia repairs in 2013 and recurrent repair in 2014. He states the mass appeared shortly after his repair. On exam, Dr. Pabone appreciated a 3 cm spermatic cord mass. Definite inguinal hernia could not be palpated.  Subsequent CT abdomen pelvis performs 02/01/24, should a moderate right inguinal hernia containing a significant portion of the urinary bladder. No bowel was present in the hernia. Dr Dana Duncan plans a right robotic system laparoscopic angular hernia repair and referred to me for evaluation of a right hydrocelectomy.    PMH: Past Medical History:  Diagnosis Date   Breast complaint 08/23/2010   Breast screening, unspecified 08/24/2011   Calculus of gallbladder with other cholecystitis, without mention of obstruction 08/24/2011   Hyperlipidemia    Inguinal hernia without mention of obstruction or gangrene, recurrent unilateral or unspecified 08/23/2012   right   Lymphoma (HCC)    Obesity, unspecified 08/24/2011   Other malignant lymphomas of lymph nodes of multiple  sites 08/23/2010    Surgical History: Past Surgical History:  Procedure Laterality Date   AXILLARY LYMPH NODE BIOPSY Left 2012   BONE MARROW TRANSPLANT  2013   CHOLECYSTECTOMY  2013   HERNIA REPAIR Right 2013   HERNIA REPAIR Right 2014   recurrent RIH   PORTACATH PLACEMENT  2013   VASECTOMY  11/03/2012    Home Medications:  Allergies as of 02/08/2024   No Known Allergies      Medication List        Accurate as of February 08, 2024  3:35 PM. If you have any questions, ask your nurse or doctor.          Lipitor 40 MG tablet Generic drug: atorvastatin Take 40 mg by mouth daily.        Allergies: No Known Allergies  Social History:  reports that he has never smoked. He has never been exposed to tobacco smoke. He has never used smokeless tobacco. He reports that he does not drink alcohol and does not use drugs.   Physical Exam: BP 134/84   Pulse 78   Ht 5' 5 (1.651 m)   Wt 212 lb (96.2 kg)   BMI 35.28 kg/m   Constitutional:  Alert and oriented, No acute distress. HEENT: [place] AT, moist mucus membranes.  Trachea midline, no masses. Cardiovascular: No clubbing, cyanosis, or edema. Respiratory: Normal respiratory effort, no increased work of breathing. GI: Abdomen is soft, nontender, nondistended, no abdominal masses GU: Phallus without lesion. Testes descended bilaterally without masses or tenderness. Superior to the right testis is a soft,  compressible 3 cm mass. Unable to delineate the superior aspect of this mass. Skin: No rashes, bruises or suspicious lesions. Neurologic: Grossly intact, no focal deficits, moving all 4 extremities. Psychiatric: Normal mood and affect.   Pertinent Imaging:  CT was personally reviewed and interpreted.   CT  EXAM: CT ABDOMEN AND PELVIS WITH CONTRAST   TECHNIQUE: Multidetector CT imaging of the abdomen and pelvis was performed using the standard protocol following bolus administration of intravenous contrast.   RADIATION  DOSE REDUCTION: This exam was performed according to the departmental dose-optimization program which includes automated exposure control, adjustment of the mA and/or kV according to patient size and/or use of iterative reconstruction technique.   CONTRAST:  OMNIPAQUE  IOHEXOL  300 MG/ML  SOLN   COMPARISON:  None Available.   FINDINGS: Lower Chest: No acute findings.   Hepatobiliary: No suspicious hepatic masses identified. Prior cholecystectomy. No evidence of biliary obstruction.   Pancreas:  No mass or inflammatory changes.   Spleen: Within normal limits in size and appearance.   Adrenals/Urinary Tract: No suspicious masses identified. No evidence of ureteral calculi or hydronephrosis.   Stomach/Bowel: No evidence of obstruction, inflammatory process or abnormal fluid collections.   Vascular/Lymphatic: No pathologically enlarged lymph nodes. No acute vascular findings.   Reproductive:  No mass or other significant abnormality.   Other: A moderate size right inguinal hernia is seen which contains a significant portion of the urinary bladder. No herniated bowel loops.   Musculoskeletal:  No suspicious bone lesions identified.   IMPRESSION: Moderate right inguinal hernia, which contains a significant portion of the urinary bladder.   No evidence of recurrent lymphoma.     Electronically Signed   By: Marlyce Sine M.D.   On: 02/01/2024 13:20    Assessment & Plan:    1. Right spermatic cord mass His hernia involves a portion of the bladder. Although he states the mass enlarges with bladder filling. Our own review of his CT unlikely, the bladder is this far distal in the hernia We discussed possibilities of cord hydrocele versus a cord lipoma.  Will schedule a scrotal ultrasound for further evaluation, know of these chromatic chord mass.  [facility] [address], [address] [place], [place] [address] [phone]

## 2024-02-08 NOTE — Progress Notes (Signed)
 Outpatient Surgical Follow Up    MCCORMICK MACON is an 53 y.o. male.   Chief Complaint  Patient presents with   Follow-up    HPI:  ARIEH BOGUE is a 53 y.o. male hx mantle cell lymphoma. Seen in consultation for Right inguinal bulge.  Prior cholecystectomy 2013 and prior Right INg hernia repair 2013 and recurred in 2014. I have reviewed op reports.  He endorses intermittent discomfort and pain that is mild located in the right scrotal area.  He says that when he is constipated and he defecates he feels that this mass hardens similar to an egg consistency. No specific alleviating or aggravating factors. He is very active and is able to perform more than 4 METS of activity without any chest pain or shortness of breath.  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  He is still working [employer] back for reevaluation.  I have personally reviewed the CT scan showing evidence of a right inguinal hernia as well as a hydrocele.  Please note that these are 2 separate processes  . 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  Social History:  reports that he has never smoked. He has never been exposed to tobacco smoke. He has never used smokeless tobacco.  He reports that he does not drink alcohol and does not use drugs.  Allergies: No Known Allergies  Medications reviewed.    ROS Full ROS performed and is otherwise negative other than what is stated in HPI   BP (!) 145/77   Pulse 77   Ht 5' 3 (1.6 m)   Wt 212 lb (96.2 kg)   SpO2 97%   BMI 37.55 kg/m   Physical Exam CONSTITUTIONAL: NAD. EYES: Pupils are equal, round,  Sclera are non-icteric. EARS, NOSE, MOUTH AND THROAT: The oropharynx is clear. The oral mucosa is pink and moist. Hearing is intact to voice. LYMPH NODES:  Lymph nodes in the neck are normal. RESPIRATORY:  Lungs are clear. There is normal respiratory effort, with equal breath sounds bilaterally, and without pathologic use of accessory muscles. CARDIOVASCULAR: Heart is regular without murmurs, gallops, or rubs. GI: The abdomen is  soft, nontender, and nondistended. There are no palpable masses. There is no hepatosplenomegaly. There are normal bowel sounds .  Reducible right inguinal hernia prior open inguinal hernia scar  GU: Normal testicles w/o masses, normal penis and meatus, evidence of  soft 3 x 3 cm mass involving the spermatic cord MUSCULOSKELETAL: Normal muscle strength and tone. No cyanosis or edema.   SKIN: Turgor is good and there are no pathologic skin lesions or ulcers. NEUROLOGIC: Motor and sensation is grossly normal. Cranial nerves are grossly intact. PSYCH:  Oriented to person, place  and time. Affect is normal.    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  An extensive discussion with the patient and explained to him that he had 2 different processes 1 is a hydrocele and what is the inguinal hernia.  I do think that both will need to be addressed.  I have discussed with Dr. Cherylene Corrente and he is in agreement with hydrocelectomy during the same operative time as the inguinal hernia repair.  I do think that a robotic inguinal hernia repair is  indicated in this case given that he had 2 prior open repairs. Procedure discussed with the patient in detail.  Risk, benefits and possible medications including but not limited to: Bleeding, bowel injuries, recurrence, chronic pain, mesh issues.  I personally spent a total of 40 minutes in the care of the patient today including performing a medically appropriate exam/evaluation, counseling and educating, placing orders, referring and communicating with other health care professionals, documenting clinical information in the EHR, independently interpreting and reviewing images studies and coordinating care.   Evelia Hipp, MD [facility] General Surgeon

## 2024-02-08 NOTE — H&P (View-Only) (Signed)
 Outpatient Surgical Follow Up    MCCORMICK MACON is an 53 y.o. male.   Chief Complaint  Patient presents with   Follow-up    HPI:  ARIEH BOGUE is a 53 y.o. male hx mantle cell lymphoma. Seen in consultation for Right inguinal bulge.  Prior cholecystectomy 2013 and prior Right INg hernia repair 2013 and recurred in 2014. I have reviewed op reports.  He endorses intermittent discomfort and pain that is mild located in the right scrotal area.  He says that when he is constipated and he defecates he feels that this mass hardens similar to an egg consistency. No specific alleviating or aggravating factors. He is very active and is able to perform more than 4 METS of activity without any chest pain or shortness of breath.  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  He is still working [employer] back for reevaluation.  I have personally reviewed the CT scan showing evidence of a right inguinal hernia as well as a hydrocele.  Please note that these are 2 separate processes  . 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  Social History:  reports that he has never smoked. He has never been exposed to tobacco smoke. He has never used smokeless tobacco.  He reports that he does not drink alcohol and does not use drugs.  Allergies: No Known Allergies  Medications reviewed.    ROS Full ROS performed and is otherwise negative other than what is stated in HPI   BP (!) 145/77   Pulse 77   Ht 5' 3 (1.6 m)   Wt 212 lb (96.2 kg)   SpO2 97%   BMI 37.55 kg/m   Physical Exam CONSTITUTIONAL: NAD. EYES: Pupils are equal, round,  Sclera are non-icteric. EARS, NOSE, MOUTH AND THROAT: The oropharynx is clear. The oral mucosa is pink and moist. Hearing is intact to voice. LYMPH NODES:  Lymph nodes in the neck are normal. RESPIRATORY:  Lungs are clear. There is normal respiratory effort, with equal breath sounds bilaterally, and without pathologic use of accessory muscles. CARDIOVASCULAR: Heart is regular without murmurs, gallops, or rubs. GI: The abdomen is  soft, nontender, and nondistended. There are no palpable masses. There is no hepatosplenomegaly. There are normal bowel sounds .  Reducible right inguinal hernia prior open inguinal hernia scar  GU: Normal testicles w/o masses, normal penis and meatus, evidence of  soft 3 x 3 cm mass involving the spermatic cord MUSCULOSKELETAL: Normal muscle strength and tone. No cyanosis or edema.   SKIN: Turgor is good and there are no pathologic skin lesions or ulcers. NEUROLOGIC: Motor and sensation is grossly normal. Cranial nerves are grossly intact. PSYCH:  Oriented to person, place  and time. Affect is normal.    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  An extensive discussion with the patient and explained to him that he had 2 different processes 1 is a hydrocele and what is the inguinal hernia.  I do think that both will need to be addressed.  I have discussed with Dr. Cherylene Corrente and he is in agreement with hydrocelectomy during the same operative time as the inguinal hernia repair.  I do think that a robotic inguinal hernia repair is  indicated in this case given that he had 2 prior open repairs. Procedure discussed with the patient in detail.  Risk, benefits and possible medications including but not limited to: Bleeding, bowel injuries, recurrence, chronic pain, mesh issues.  I personally spent a total of 40 minutes in the care of the patient today including performing a medically appropriate exam/evaluation, counseling and educating, placing orders, referring and communicating with other health care professionals, documenting clinical information in the EHR, independently interpreting and reviewing images studies and coordinating care.   Evelia Hipp, MD [facility] General Surgeon

## 2024-02-12 ENCOUNTER — Ambulatory Visit: Payer: Self-pay | Admitting: Urology

## 2024-02-13 ENCOUNTER — Encounter
Admission: RE | Admit: 2024-02-13 | Discharge: 2024-02-13 | Disposition: A | Discharge status: Home / Self Care | Source: Ambulatory Visit | Attending: Surgery | Admitting: Surgery

## 2024-02-13 ENCOUNTER — Other Ambulatory Visit: Payer: Self-pay

## 2024-02-13 HISTORY — DX: Elevated blood-pressure reading, without diagnosis of hypertension: R03.0

## 2024-02-13 HISTORY — DX: Other specified disorders of the male genital organs: N50.89

## 2024-02-13 HISTORY — DX: Mantle cell lymphoma, unspecified site: C83.10

## 2024-02-13 HISTORY — DX: Unilateral inguinal hernia, without obstruction or gangrene, recurrent: K40.91

## 2024-02-13 NOTE — Progress Notes (Signed)
 Anesthesia interview done with pt using Language Line ID # [phone]

## 2024-02-13 NOTE — Patient Instructions (Signed)
 Preparing for Surgery with CHLORHEXIDINE GLUCONATE (CHG) Soap  Chlorhexidine Gluconate (CHG) Soap  o An antiseptic cleaner that kills germs and bonds with the skin to continue killing germs even after washing  o Used for showering the night before surgery and morning of surgery  Before surgery, you can play an important role by reducing the number of germs on your skin.  CHG (Chlorhexidine gluconate) soap is an antiseptic cleanser which kills germs and bonds with the skin to continue killing germs even after washing.  Please do not use if you have an allergy to CHG or antibacterial soaps. If your skin becomes reddened/irritated stop using the CHG.  1. Shower the NIGHT BEFORE SURGERY and the MORNING OF SURGERY with CHG soap.  2. If you choose to wash your hair, wash your hair first as usual with your normal shampoo.  3. After shampooing, rinse your hair and body thoroughly to remove the shampoo.  4. Use CHG as you would any other liquid soap. You can apply CHG directly to the skin and wash gently with a scrungie or a clean washcloth.  5. Apply the CHG soap to your body only from the neck down. Do not use on open wounds or open sores. Avoid contact with your eyes, ears, mouth, and genitals (private parts). Wash face and genitals (private parts) with your normal soap.  6. Wash thoroughly, paying special attention to the area where your surgery will be performed.  7. Thoroughly rinse your body with warm water.  8. Do not shower/wash with your normal soap after using and rinsing off the CHG soap.  9. Pat yourself dry with a clean towel.  10. Wear clean pajamas to bed the night before surgery.  12. Place clean sheets on your bed the night of your first shower and do not sleep with pets.  13. Shower again with the CHG soap on the day of surgery prior to arriving at the hospital.  14. Do not apply any deodorants/lotions/powders.  15. Please wear clean clothes to the  hospital.  Preparacin para la Ciruga con Jabn de Gluconato de Clorhexidina (CHG)  Jabn de Gluconato de Clorhexidina (CHG)  o Un limpiador antisptico que elimina los grmenes y se adhiere a la piel para continuar eliminndolos incluso despus del lavado.  o Se usa  para ducharse la noche anterior y la maana siguiente a la [place].  Antes de la [place], usted puede contribuir significativamente a reducir la cantidad de grmenes en su piel.  El jabn de Gluconato de Clorhexidina (CHG) es un limpiador antisptico que elimina los grmenes y se adhiere a la piel para continuar eliminndolos incluso despus del lavado.  No lo use si tiene alergia al [employer] de Clorhexidina o a los jabones antibacterianos. Si su piel se enrojece o se irrita, suspenda el uso del Gluconato de [place].  1. Dchese la noche anterior a la ciruga y la maana siguiente a la ciruga con jabn de Gluconato de Media planner.  2. Si decide lavarse el cabello, lvelo primero como de costumbre con su champ habitual.  3. Despus de lavarse con champ, enjuague bien el cabello y el cuerpo para Risk manager.  4. Use el CHG como cualquier otro jabn lquido. Puede aplicar el CHG directamente sobre la piel y lavarse suavemente con una toallita o una toalla limpia.  5. Aplique el jabn CHG en el cuerpo solo del cuello para abajo. No lo use sobre heridas o Advertising copywriter. Evite el contacto con los ojos,  odos, boca y genitales (partes ntimas). Lave la cara y los genitales (partes ntimas) con su jabn habitual.  6. Lvese bien, prestando especial atencin a la zona donde se Retail buyer.  7. Enjuague bien el cuerpo con agua tibia.  8. No se duche ni se lave con su jabn habitual despus de usar y enjuagar el jabn CHG.  9. Squese con palmaditas suaves con una toalla limpia.  10. Use pijama limpio para dormir la noche anterior a la ciruga.  12. Coloque sbanas limpias en su cama la noche de su  primera ducha y no duerma con mascotas.  13. Vuelva a ducharse con jabn CHG el da de la ciruga antes de llegar al hospital.  14. No se aplique desodorante, locin ni polvos.  15. Por favor, use ropa limpia para ir al hospital.

## 2024-02-13 NOTE — Patient Instructions (Addendum)
 Your procedure is scheduled on:02-21-24 Tuesday Report to the Registration Desk on the 1st floor of the [facility].Then proceed to the Surgery Desk on the 2nd floor  To find out your arrival time, please call [phone] between 1PM - 3PM on:02-20-24 Monday If your arrival time is 6:00 am, do not arrive before that time as the [facility] Mall entrance doors do not open until 6:00 am.  REMEMBER: Instructions that are not followed completely may result in serious medical risk, up to and including death; or upon the discretion of your surgeon and anesthesiologist your surgery may need to be rescheduled.  Do not eat food after midnight the night before surgery.  No gum chewing or hard candies.  You may however, drink CLEAR liquids up to 2 hours before you are scheduled to arrive for your surgery. Do not drink anything within 2 hours of your scheduled arrival time.  Clear liquids include: - water  - apple juice without pulp - gatorade (not RED colors) - black coffee or tea (Do NOT add milk or creamers to the coffee or tea) Do NOT drink anything that is not on this list.  One week prior to surgery:Stop NOW (02-13-24) Stop Anti-inflammatories (NSAIDS) such as Advil, Aleve, Ibuprofen, Motrin, Naproxen, Naprosyn and Aspirin based products such as Excedrin, Goody's Powder, BC Powder. Stop ANY OVER THE COUNTER supplements until after surgery.  You may however, continue to take Tylenol if needed for pain up until the day of surgery.  Continue taking all of your other prescription medications up until the day of surgery.  Do NOT take any medication the day of surgery  No Alcohol for 24 hours before or after surgery.  No Smoking including e-cigarettes for 24 hours before surgery.  No chewable tobacco products for at least 6 hours before surgery.  No nicotine patches on the day of surgery.  Do not use any recreational drugs for at least a week (preferably 2 weeks) before your surgery.  Please  be advised that the combination of cocaine and anesthesia may have negative outcomes, up to and including death. If you test positive for cocaine, your surgery will be cancelled.  On the morning of surgery brush your teeth with toothpaste and water, you may rinse your mouth with mouthwash if you wish. Do not swallow any toothpaste or mouthwash.  Use CHG Soap as directed on instruction sheet.  Do not wear jewelry, make-up, hairpins, clips or nail polish.  For welded (permanent) jewelry: bracelets, anklets, waist bands, etc.  Please have this removed prior to surgery.  If it is not removed, there is a chance that hospital personnel will need to cut it off on the day of surgery.  Do not wear lotions, powders, or perfumes.   Do not shave body hair from the neck down 48 hours before surgery.  Contact lenses, hearing aids and dentures may not be worn into surgery.  Do not bring valuables to the hospital. [facility] is not responsible for any missing/lost belongings or valuables.   Notify your doctor if there is any change in your medical condition (cold, fever, infection).  Wear comfortable clothing (specific to your surgery type) to the hospital.  After surgery, you can help prevent lung complications by doing breathing exercises.  Take deep breaths and cough every 1-2 hours. Your doctor may order a device called an Incentive Spirometer to help you take deep breaths. When coughing or sneezing, hold a pillow firmly against your incision with both hands. This  is called "splinting." Doing this helps protect your incision. It also decreases belly discomfort.  If you are being admitted to the hospital overnight, leave your suitcase in the car. After surgery it may be brought to your room.  In case of increased patient census, it may be necessary for you, the patient, to continue your postoperative care in the Same Day Surgery department.  If you are being discharged the day of surgery, you  will not be allowed to drive home. You will need a responsible individual to drive you home and stay with you for 24 hours after surgery.   If you are taking public transportation, you will need to have a responsible individual with you.  Please call the Pre-admissions Testing Dept. at [phone] if you have any questions about these instructions.  Surgery Visitation Policy:  Patients having surgery or a procedure may have two visitors.  Children under the age of 50 must have an adult with them who is not the patient.   Su procedimiento est programado para el martes 1 de julio de 2025. Presntese en el mostrador de registro, ubicado en Information systems manager del [employer] Luego, dirjase al [employer] de [place], ubicado en el [employer]. Para conocer su hora de llegada, llame al [phone] entre la 1 p. m. y las 3 p. m. el lunes 30 de junio de 2025. Si su hora de llegada es a las 6:00 a. m., no llegue antes, ya que las puertas de [place] del [address] no abren [employer] 6:00 a. m.  RECUERDE: El incumplimiento de las instrucciones puede resultar en un riesgo mdico grave, que puede incluir la [place]; o, a discrecin de su cirujano y Scientific laboratory technician, su ciruga podra tener que reprogramarse.  No consuma alimentos despus de la medianoche anterior a la ciruga.  No mastique chicle ni caramelos duros.  Sin embargo, puede beber lquidos claros hasta 2 horas antes de su hora de llegada programada para la [place]. No beba nada dentro de las 2 horas previas a su hora de llegada programada. Los lquidos claros incluyen: - Agua - Jugo de manzana sin pulpa - Gatorade (sin colorantes ROJOS) - Caf o t negro (NO agregue leche ni cremas al caf o t). NO beba nada que no est en esta lista.  Una semana antes de la ciruga: Suspenda su consumo AHORA (23/01/2024). Suspenda los antiinflamatorios (AINE) como Advil, Aleve, ibuprofeno, Motrin, naproxeno, Naproxyn y productos a base de aspirina  como Excedrin, Goody's Powder y BC Powder. Suspenda cualquier suplemento de venta libre hasta despus de la ciruga.  Sin embargo, puede continuar tomando Tylenol si lo necesita para el dolor hasta el da de la [place].  Contine tomando todos sus dems medicamentos recetados hasta el da de la ciruga.  NO tome ningn medicamento el da de la [place].  No consuma alcohol durante las 24 horas anteriores o posteriores a la [place].  No fume, incluidos los cigarrillos electrnicos, durante las 24 horas anteriores a la [place]. No consuma tabaco masticable durante al menos 6 horas antes de la [place]. No use parches de nicotina el da de la [place].  No consuma drogas recreativas durante al menos una semana (preferiblemente 2 semanas) antes de la ciruga.  Tenga en cuenta que la combinacin de cocana y anestesia puede tener consecuencias negativas, incluso la [place]. Si da positivo en la prueba de cocana, se cancelar la ciruga.  La maana de la ciruga, cepllese los dientes con pasta dental y grey; [place]  enjuagarse la boca con enjuague bucal si lo desea. No ingiera pasta dental ni enjuague bucal.  Use jabn CHG segn las instrucciones.  No use joyas, maquillaje, horquillas, broches ni esmalte de uas.  Para joyas soldadas (permanentes): pulseras, tobilleras, fajas, etc., quteselas antes de la ciruga. Si no se las [place], es posible que el personal del hospital tenga que cortarlas el da de la [place].  No use lociones, polvos ni perfumes.  No se afeite el vello corporal del cuello para abajo 48 horas antes de la [place].  No se permiten lentes de contacto, audfonos ni dentaduras postizas durante la [place].  No traiga objetos de valor al hospital. [facility] no se hace responsable de la prdida de pertenencias o objetos de valor.  Informe a su mdico si nota algn cambio en su estado de salud (resfriado, fiebre, infeccin).  Lleve ropa cmoda (especfica para el tipo de  [place]) al hospital.  Despus de la [place], puede ayudar a prevenir complicaciones pulmonares haciendo ejercicios de respiracin.  Respire profundamente y tosa cada 1 o 2 horas. Su mdico podra recetarle un dispositivo llamado espirmetro incentivador para ayudarle a respirar profundamente. Al toser o Engineering geologist, sostenga firmemente una almohada contra la incisin con ambas manos. Esto se llama entablillar. Esto ayuda a Engineer, drilling incisin y tambin disminuye las molestias abdominales.  Si va a pasar la noche en el hospital, deje su maleta en el auto. Despus de la [place], es posible que se la lleven a su habitacin.  En caso de un aumento en el nmero de [place], podra ser necesario que usted, el [place], contine su atencin posoperatoria en el departamento de Ciruga Ambulatoria.  Si le dan de alta el mismo da de la [place], no se le permitir conducir a casa. Necesitar una persona responsable que lo lleve a casa y lo acompae durante 24 horas despus de la [place].  Si utiliza transporte pblico, necesitar una persona responsable con usted.  Si tiene [employer], llame al Departamento de [place]ias al Ingreso al [phone].  Poltica de Visitas a la Ciruga:  Los pacientes que se sometan a bosnia and [place] o procedimiento pueden [employer] visitantes.  Los [employer] de 16 aos deben estar acompaados por un adulto que no sea [place].

## 2024-02-20 MED ORDER — CHLORHEXIDINE GLUCONATE CLOTH 2 % EX PADS: 6.0000 | MEDICATED_PAD | Freq: Once | CUTANEOUS | Status: DC

## 2024-02-20 MED ORDER — ORAL CARE MOUTH RINSE
15.0000 mL | Freq: Once | OROMUCOSAL | Status: AC
Start: 2024-02-20 — End: 2024-02-21

## 2024-02-20 MED ORDER — CEFAZOLIN SODIUM-DEXTROSE 2-4 GM/100ML-% IV SOLN
2.0000 g | INTRAVENOUS | Status: AC
  Administered 2024-02-21: 2 g via INTRAVENOUS

## 2024-02-20 MED ORDER — CELECOXIB 200 MG PO CAPS
200.0000 mg | ORAL_CAPSULE | ORAL | Status: AC
  Administered 2024-02-21: 200 mg via ORAL

## 2024-02-20 MED ORDER — CHLORHEXIDINE GLUCONATE 0.12 % MT SOLN
15.0000 mL | Freq: Once | OROMUCOSAL | Status: AC
  Administered 2024-02-21: 15 mL via OROMUCOSAL

## 2024-02-20 MED ORDER — ACETAMINOPHEN 500 MG PO TABS
1000.0000 mg | ORAL_TABLET | ORAL | Status: AC
  Administered 2024-02-21: 1000 mg via ORAL

## 2024-02-20 MED ORDER — GABAPENTIN 300 MG PO CAPS
300.0000 mg | ORAL_CAPSULE | ORAL | Status: AC
  Administered 2024-02-21: 300 mg via ORAL

## 2024-02-20 MED ORDER — LACTATED RINGERS IV SOLN: INTRAVENOUS | Status: DC

## 2024-02-21 ENCOUNTER — Ambulatory Visit
Admission: RE | Admit: 2024-02-21 | Discharge: 2024-02-21 | Disposition: A | Discharge status: Home / Self Care | Attending: Surgery | Admitting: Surgery

## 2024-02-21 ENCOUNTER — Encounter
Admission: RE | Disposition: A | Discharge status: Home / Self Care | Payer: Self-pay | Source: Home / Self Care | Attending: Surgery

## 2024-02-21 ENCOUNTER — Ambulatory Visit

## 2024-02-21 ENCOUNTER — Other Ambulatory Visit: Payer: Self-pay

## 2024-02-21 ENCOUNTER — Encounter: Payer: Self-pay | Admitting: Surgery

## 2024-02-21 ENCOUNTER — Telehealth: Payer: Self-pay | Admitting: Surgery

## 2024-02-21 DIAGNOSIS — K4031 Unilateral inguinal hernia, with obstruction, without gangrene, recurrent: Secondary | ICD-10-CM | POA: Diagnosis not present

## 2024-02-21 DIAGNOSIS — K4091 Unilateral inguinal hernia, without obstruction or gangrene, recurrent: Secondary | ICD-10-CM | POA: Diagnosis present

## 2024-02-21 DIAGNOSIS — Z8572 Personal history of non-Hodgkin lymphomas: Secondary | ICD-10-CM | POA: Diagnosis not present

## 2024-02-21 DIAGNOSIS — R1031 Right lower quadrant pain: Secondary | ICD-10-CM

## 2024-02-21 DIAGNOSIS — Z9049 Acquired absence of other specified parts of digestive tract: Secondary | ICD-10-CM | POA: Diagnosis not present

## 2024-02-21 HISTORY — PX: INSERTION OF MESH: SHX5868

## 2024-02-21 HISTORY — PX: HERNIORRHAPHY, INGUINAL, ROBOT-ASSISTED, LAPAROSCOPIC: SHX7585

## 2024-02-21 SURGERY — HERNIORRHAPHY, INGUINAL, ROBOT-ASSISTED, LAPAROSCOPIC
Anesthesia: General | Site: Inguinal | Laterality: Right

## 2024-02-21 MED ORDER — ONDANSETRON HCL 4 MG/2ML IJ SOLN
INTRAMUSCULAR | Status: DC | PRN
  Administered 2024-02-21: 4 mg via INTRAVENOUS

## 2024-02-21 MED ORDER — ROCURONIUM BROMIDE 100 MG/10ML IV SOLN
INTRAVENOUS | Status: DC | PRN
  Administered 2024-02-21 (×2): 50 mg via INTRAVENOUS

## 2024-02-21 MED ORDER — MIDAZOLAM HCL 2 MG/2ML IJ SOLN
INTRAMUSCULAR | Status: AC
  Filled 2024-02-21: qty 2

## 2024-02-21 MED ORDER — FENTANYL CITRATE (PF) 100 MCG/2ML IJ SOLN
INTRAMUSCULAR | Status: AC
  Filled 2024-02-21: qty 2

## 2024-02-21 MED ORDER — ROCURONIUM BROMIDE 10 MG/ML (PF) SYRINGE
PREFILLED_SYRINGE | INTRAVENOUS | Status: AC
  Filled 2024-02-21: qty 10

## 2024-02-21 MED ORDER — LACTATED RINGERS IV SOLN: INTRAVENOUS | Status: DC | PRN

## 2024-02-21 MED ORDER — FENTANYL CITRATE (PF) 100 MCG/2ML IJ SOLN
INTRAMUSCULAR | Status: DC | PRN
  Administered 2024-02-21 (×2): 50 ug via INTRAVENOUS

## 2024-02-21 MED ORDER — BUPIVACAINE LIPOSOME 1.3 % IJ SUSP
INTRAMUSCULAR | Status: DC | PRN
  Administered 2024-02-21: 20 mL

## 2024-02-21 MED ORDER — BUPIVACAINE-EPINEPHRINE (PF) 0.25% -1:200000 IJ SOLN
INTRAMUSCULAR | Status: AC
Start: 2024-02-21 — End: 2024-02-21
  Filled 2024-02-21: qty 30

## 2024-02-21 MED ORDER — GABAPENTIN 300 MG PO CAPS
ORAL_CAPSULE | ORAL | Status: AC
Start: 2024-02-21 — End: 2024-02-21
  Filled 2024-02-21: qty 1

## 2024-02-21 MED ORDER — BUPIVACAINE LIPOSOME 1.3 % IJ SUSP
INTRAMUSCULAR | Status: AC
  Filled 2024-02-21: qty 20

## 2024-02-21 MED ORDER — GLYCOPYRROLATE 0.2 MG/ML IJ SOLN
INTRAMUSCULAR | Status: DC | PRN
  Administered 2024-02-21: .2 mg via INTRAVENOUS

## 2024-02-21 MED ORDER — DROPERIDOL 2.5 MG/ML IJ SOLN: 0.6250 mg | Freq: Once | INTRAMUSCULAR | Status: DC | PRN

## 2024-02-21 MED ORDER — PROPOFOL 10 MG/ML IV BOLUS
INTRAVENOUS | Status: DC | PRN
  Administered 2024-02-21 (×2): 150 ug/kg/min via INTRAVENOUS
  Administered 2024-02-21: 50 ug/kg/min via INTRAVENOUS

## 2024-02-21 MED ORDER — CEFAZOLIN SODIUM-DEXTROSE 2-4 GM/100ML-% IV SOLN
INTRAVENOUS | Status: AC
  Filled 2024-02-21: qty 100

## 2024-02-21 MED ORDER — CHLORHEXIDINE GLUCONATE 0.12 % MT SOLN
OROMUCOSAL | Status: AC
  Filled 2024-02-21: qty 15

## 2024-02-21 MED ORDER — SEVOFLURANE IN SOLN
RESPIRATORY_TRACT | Status: AC
  Filled 2024-02-21: qty 250

## 2024-02-21 MED ORDER — SUGAMMADEX SODIUM 200 MG/2ML IV SOLN
INTRAVENOUS | Status: DC | PRN
  Administered 2024-02-21: 200 mg via INTRAVENOUS

## 2024-02-21 MED ORDER — MIDAZOLAM HCL 2 MG/2ML IJ SOLN
INTRAMUSCULAR | Status: DC | PRN
  Administered 2024-02-21: 2 mg via INTRAVENOUS

## 2024-02-21 MED ORDER — LIDOCAINE HCL (CARDIAC) PF 100 MG/5ML IV SOSY
PREFILLED_SYRINGE | INTRAVENOUS | Status: DC | PRN
  Administered 2024-02-21: 100 mg via INTRAVENOUS

## 2024-02-21 MED ORDER — LIDOCAINE HCL (PF) 2 % IJ SOLN
INTRAMUSCULAR | Status: AC
Start: 2024-02-21 — End: 2024-02-21
  Filled 2024-02-21: qty 5

## 2024-02-21 MED ORDER — BUPIVACAINE-EPINEPHRINE 0.25% -1:200000 IJ SOLN
INTRAMUSCULAR | Status: DC | PRN
  Administered 2024-02-21: 30 mL

## 2024-02-21 MED ORDER — FENTANYL CITRATE (PF) 100 MCG/2ML IJ SOLN
25.0000 ug | INTRAMUSCULAR | Status: DC | PRN
  Administered 2024-02-21 (×2): 50 ug via INTRAVENOUS

## 2024-02-21 MED ORDER — ONDANSETRON HCL 4 MG/2ML IJ SOLN
INTRAMUSCULAR | Status: AC
  Filled 2024-02-21: qty 2

## 2024-02-21 MED ORDER — DEXAMETHASONE SODIUM PHOSPHATE 10 MG/ML IJ SOLN
INTRAMUSCULAR | Status: DC | PRN
  Administered 2024-02-21: 10 mg via INTRAVENOUS

## 2024-02-21 MED ORDER — ACETAMINOPHEN 500 MG PO TABS
ORAL_TABLET | ORAL | Status: AC
  Filled 2024-02-21: qty 2

## 2024-02-21 MED ORDER — CELECOXIB 200 MG PO CAPS
ORAL_CAPSULE | ORAL | Status: AC
  Filled 2024-02-21: qty 1

## 2024-02-21 MED ORDER — HYDROCODONE-ACETAMINOPHEN 5-325 MG PO TABS: 1.0000 | ORAL_TABLET | ORAL | 0 refills | Status: DC | PRN

## 2024-02-21 MED ORDER — PROPOFOL 1000 MG/100ML IV EMUL
INTRAVENOUS | Status: AC
  Filled 2024-02-21: qty 100

## 2024-02-21 MED ORDER — DEXAMETHASONE SODIUM PHOSPHATE 10 MG/ML IJ SOLN
INTRAMUSCULAR | Status: AC
  Filled 2024-02-21: qty 1

## 2024-02-21 SURGICAL SUPPLY — 36 items
CANNULA REDUCER 12-8 DVNC XI (CANNULA) ×2 IMPLANT
COVER TIP SHEARS 8 DVNC (MISCELLANEOUS) ×2 IMPLANT
COVER WAND RF STERILE (DRAPES) ×2 IMPLANT
DERMABOND ADVANCED .7 DNX12 (GAUZE/BANDAGES/DRESSINGS) ×2 IMPLANT
DRAPE ARM DVNC X/XI (DISPOSABLE) ×6 IMPLANT
DRAPE COLUMN DVNC XI (DISPOSABLE) ×2 IMPLANT
ELECTRODE REM PT RTRN 9FT ADLT (ELECTROSURGICAL) ×2 IMPLANT
FORCEPS BPLR R/ABLATION 8 DVNC (INSTRUMENTS) ×2 IMPLANT
FORCEPS PROGRASP DVNC XI (FORCEP) ×2 IMPLANT
GLOVE BIO SURGEON STRL SZ7 (GLOVE) ×8 IMPLANT
GOWN STRL REUS W/ TWL LRG LVL3 (GOWN DISPOSABLE) ×8 IMPLANT
IRRIGATION STRYKERFLOW (MISCELLANEOUS) ×2 IMPLANT
IV NS 1000ML BAXH (IV SOLUTION) IMPLANT
KIT PINK PAD W/HEAD ARM REST (MISCELLANEOUS) ×2 IMPLANT
LABEL OR SOLS (LABEL) ×2 IMPLANT
MANIFOLD NEPTUNE II (INSTRUMENTS) ×2 IMPLANT
MESH 3DMAX 4X6 RT LRG (Mesh General) IMPLANT
NDL DRIVE SUT CUT DVNC (INSTRUMENTS) ×2 IMPLANT
NDL HYPO 22X1.5 SAFETY MO (MISCELLANEOUS) ×2 IMPLANT
NEEDLE DRIVE SUT CUT DVNC (INSTRUMENTS) ×2 IMPLANT
NEEDLE HYPO 22X1.5 SAFETY MO (MISCELLANEOUS) ×2 IMPLANT
OBTURATOR OPTICALSTD 8 DVNC (TROCAR) ×2 IMPLANT
PACK LAP CHOLECYSTECTOMY (MISCELLANEOUS) ×2 IMPLANT
SCISSORS MNPLR CVD DVNC XI (INSTRUMENTS) ×2 IMPLANT
SEAL UNIV 5-12 XI (MISCELLANEOUS) ×4 IMPLANT
SET TUBE SMOKE EVAC HIGH FLOW (TUBING) ×2 IMPLANT
SOLUTION ELECTROSURG ANTI STCK (MISCELLANEOUS) ×2 IMPLANT
SPONGE T-LAP 18X18 ~~LOC~~+RFID (SPONGE) ×2 IMPLANT
SUT MNCRL AB 4-0 PS2 18 (SUTURE) ×2 IMPLANT
SUT STRATA 3-0 SH (SUTURE) ×4 IMPLANT
SUT VIC AB 2-0 SH 27XBRD (SUTURE) IMPLANT
SUT VICRYL 0 UR6 27IN ABS (SUTURE) ×4 IMPLANT
SYR 30ML LL (SYRINGE) ×2 IMPLANT
TAPE TRANSPORE STRL 2 31045 (GAUZE/BANDAGES/DRESSINGS) ×2 IMPLANT
TRAP FLUID SMOKE EVACUATOR (MISCELLANEOUS) ×2 IMPLANT
WATER STERILE IRR 500ML POUR (IV SOLUTION) ×2 IMPLANT

## 2024-02-21 NOTE — Anesthesia Postprocedure Evaluation (Signed)
 Anesthesia Post Note  Patient: Craig Williamson  Procedure(s) Performed: HERNIORRHAPHY, INGUINAL, ROBOT-ASSISTED, LAPAROSCOPIC (Right: Inguinal) INSERTION OF MESH  Patient location during evaluation: PACU Anesthesia Type: General Level of consciousness: awake and alert Pain management: pain level controlled Vital Signs Assessment: post-procedure vital signs reviewed and stable Respiratory status: spontaneous breathing, nonlabored ventilation and respiratory function stable Cardiovascular status: blood pressure returned to baseline and stable Postop Assessment: no apparent nausea or vomiting Anesthetic complications: no   There were no known notable events for this encounter.   Last Vitals:  Vitals:   02/21/24 1038 02/21/24 1130  BP: (!) 149/90 (!) 150/88  Pulse: 67 79  Resp: 18 17  Temp: (!) 36.2 C   SpO2: 99% 98%    Last Pain:  Vitals:   02/21/24 1038  TempSrc: Tympanic  PainSc: 0-No pain                 Camellia Merilee Louder

## 2024-02-21 NOTE — Progress Notes (Signed)
 Per Dr. Jordis, pt can return to work to full duty in 6 weeks.  If he returns to work prior to 6 weeks, it must be light duty.  Informed Pt's daughter Dr. Dolph office will be sending the note via mychart.

## 2024-02-21 NOTE — Discharge Instructions (Addendum)
 Ciruga laparoscpica de hernia de la ingle en adultos: qu debe esperar Laparoscopic Surgery for Groin Hernia in Adults: What to Expect  La ciruga laparoscpica de hernia de la ingle es una ciruga para tratar un punto dbil en los msculos de la ingle por el que sale tejido del interior del vientre. La hernia de la ingle tambin se denomina hernia inguinal.  Esta ciruga puede programarse o realizarse de [place]. Durante la [place], el tejido que sale del vientre se vuelve a Curator. Se cierra el orificio de los msculos de la ingle.  La ciruga laparoscpica se realiza a travs de cortes pequeos en el vientre. Se utiliza un laparoscopio con luz y cmara. Informe al [employer] sobre lo siguiente: Cualquier alergia que tenga. Todos los [employer] toma. Estos incluyen vitaminas, hierbas medicinales, gotas oftlmicas y cremas. Cualquier problema que usted o los [employer] de su familia hayan tenido con el uso de anestesia. Cualquier problema de sangrado que tenga. Cirugas a las que se haya sometido. Cualquier problema mdico que tenga. Si est embarazada o podra estarlo. Cules son los riesgos? El mdico hablar con usted [employer]. Estos pueden incluir: Infeccin. Sangrado, cogulos de sangre o acumulacin de lquido en la zona de la hernia. [place] a los [employer] o a la [place], si se alethia query [place]. Daos a estructuras cercanas en el vientre. Dolor e hinchazn a largo plazo en el escroto. Problemas para Automotive engineer. La hernia regresa luego de la [place]. En algunos casos, el mdico puede cambiar la ciruga laparoscpica por un corte grande en la ingle (ciruga abierta). Puede necesitar query ciruga abierta si sucede lo siguiente: Tiene una hernia difcil de reparar. Sangra mucho durante la ciruga laparoscpica. Tiene problemas cuando se coloca el gas en el vientre. Qu sucede antes? Cundo dejar de comer y beber Coma y beba solamente segn se lo  hayan indicado. Es posible que le indiquen lo siguiente: Ocho horas antes de la ciruga Deje de comer la mayora de los alimentos. No coma carne, alimentos fritos ni alimentos grasos. Consuma solo alimentos livianos, como tostadas o Social worker. Todos los lquidos son aceptables, excepto las bebidas energticas y el alcohol. Seis horas antes de la ciruga Deje de comer. Beba nicamente lquidos transparentes, como agua, jugo de fruta transparente, caf solo, t solo y bebidas deportivas. No tome bebidas energticas ni alcohol. Dos horas antes de la ciruga Deje de beber lquido. Es posible que le permitan tomar medicamentos con pequeos sorbos de [place]. Si no come ni bebe segn las indicaciones, la ciruga puede retrasarse o cancelarse. Medicamentos Consulte si debe cambiar o suspender lo siguiente: Los medicamentos que toma. Las vitaminas, las hierbas medicinales o los suplementos que toma. No tome aspirina ni ibuprofeno a menos que se lo indiquen. Seguridad en la ciruga Por su seguridad, es posible que: Deba lavarse la piel con un jabn germicida. Reciba antibiticos. Le marquen el lugar de la [place]. Le rasuren el vello del lugar de la ciruga. Instrucciones generales No fume, vapee ni consuma nicotina o tabaco durante al menos 4 semanas antes de la [place]. Pregunte si pasar la noche en el hospital. Si va a marcharse a casa inmediatamente despus de la ciruga, pdale a un adulto responsable que: La lleve a su casa desde el hospital o la clnica. No se le permitir conducir. Se quede con usted durante el [employer] indiquen. Qu ocurre durante la ciruga laparoscpica para la hernia de la ingle? Le pondrn una va  intravenosa en una vena de la mano o el brazo. Le administrarn lo siguiente: Un sedante para que se relaje. Anestesia para Industrial/product designer. Se le harn tres cortes pequeos en el vientre. Se le colocar gas en el vientre a travs de [employer]  cortes. Esto le permitir al Merchandiser, retail interior del vientre durante la [place]. Se introducir un laparoscopio en uno de los cortes del vientre. Este enviar una imagen a la pantalla que se encuentra en el [place]. Los instrumentos necesarios para la [place] se introducirn a travs de los otros cortes. El tejido que compone la hernia puede extirparse o volver a Museum/gallery exhibitions officer. Los bordes de la hernia se pueden coser juntos. Se puede utilizar una malla para cerrar la hernia. Se utilizarn puntos o clips para mantener la malla en su lugar. Se retirarn el laparoscopio y los instrumentos. Los cortes se cerrarn con puntos, goma para cerrar la piel o tiras de cinta adhesiva. Es posible que se coloque una venda [employer] cortes. Estos pasos pueden variar. Pregunte cmo ser. Qu sucede despus? Estar bajo observacin atenta hasta que se vaya. Esto incluye controlar el nivel de dolor, la presin arterial, la frecuencia cardaca y la frecuencia respiratoria. Seguir recibiendo lquidos y medicamentos a travs de una va intravenosa. Le retirarn la va intravenosa cuando pueda beber lquidos transparentes. Esta informacin no tiene Theme park manager el consejo del mdico. Asegrese de hacerle al mdico cualquier pregunta que tenga. Document Revised: 08/25/2023 Document Reviewed: 08/25/2023  Laparoscopic Surgery for Groin Hernia in Adults: What to Know After After a laparoscopic surgery for groin hernia, it's common to have pain, discomfort, soreness, swelling, and bruising around the cuts that were made in the belly. There may also be swelling of the scrotum in males. Follow these instructions at home: Activity Rest as told. Get up and take short walks many times during the day. This helps you breathe better and keeps your blood flowing. Ask for help if you feel weak or unsteady. Ask if it's OK for you to lift. Do not take baths, swim, or use a hot tub until you're told it's OK.  Ask if you can shower. Ask what things are safe for you to do at home. Ask when you can go back to work or school. Medicines Take your medicines only as told. You may need to take steps to help treat or prevent trouble pooping (constipation), such as: Taking medicine to help you poop. Eating foods high in fiber, like beans, whole grains, and fresh fruits and vegetables. Drinking more fluids as told. Ask your health care provider if it's safe to drive or use machines while taking your medicine. Wound care  Take care of the cuts in your belly as told. Make sure you: Wash your hands with soap and water for at least 20 seconds before and after you change your bandage. If you can't use soap and water, use hand sanitizer. Change your bandage. Leave stitches or skin glue alone. Leave tape strips alone unless you're told to take them off. You may trim the edges of the tape strips if they curl up. Check the cuts on your belly every day for signs of infection. Check for: More redness, swelling, or pain. More fluid or blood. Warmth. Pus or a bad smell. Pain management  Use ice or an ice pack as told. Place a towel between your skin and the ice. Leave the ice on for 20 minutes, 2-3 times a  day. If your skin turns red, take off the ice right away to prevent skin damage. The risk of damage is higher if you can't feel pain, heat, or cold. General instructions Do not smoke, vape, or use nicotine or tobacco. Doing this can slow healing. Wear compression stockings to reduce swelling and help prevent blood clots in your legs. You may be asked to continue to do deep breathing exercises at home. This will help to prevent a lung infection. Your provider may give you more instructions. Make sure you know what you can and can't do. Contact a health care provider if: You have any signs of infection. You have more swelling or pain in your scrotum. You have pain that gets worse or doesn't get better with  medicine. You aren't able to pee. You haven't pooped in 3 days. You have a fever. You throw up or you feel like throwing up. Get help right away if: You have redness, warmth, or pain in your leg. You have chest pain. You have trouble breathing. You have very bad pain in your belly. You throw up each time you eat or drink. These symptoms may be an emergency. Call 911 right away. Do not wait to see if the symptoms will go away. Do not drive yourself to the hospital. This information is not intended to replace advice given to you by your health care provider. Make sure you discuss any questions you have with your health care provider. Document Revised: 05/24/2023 Document Reviewed: 05/24/2023 Elsevier Patient Education  2025 [employer].

## 2024-02-21 NOTE — Interval H&P Note (Signed)
 History and Physical Interval Note:  02/21/2024 7:19 AM  Craig Williamson  has presented today for surgery, with the diagnosis of recurrent right inguinal hernia.  The various methods of treatment have been discussed with the patient and family. After consideration of risks, benefits and other options for treatment, the patient has consented to  Procedure(s): HERNIORRHAPHY, INGUINAL, ROBOT-ASSISTED, LAPAROSCOPIC (Right) as a surgical intervention.  The patient's history has been reviewed, patient examined, no change in status, stable for surgery.  I have reviewed the patient's chart and labs.  Questions were answered to the patient's satisfaction.     Craig Williamson

## 2024-02-21 NOTE — Progress Notes (Signed)
 Urology  I saw Mr. 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  His hernia did not involve a portion of the bladder and we had discussed possibility this could be a portion of bladder in the scrotum.  Dr.Pabon is going to perform laparoscopic right inguinal herniorrhaphy and if the mass is still present A hernia repaired and he desires to proceed with hydrocelectomy.  We discussed potential complications occluding bleeding, infection and recurrent hydrocele.  Possible need for a postoperative drain was also discussed.  His daughter was present with him.  All questions were answered.

## 2024-02-21 NOTE — Telephone Encounter (Signed)
 Hosp called and asked us  to write a note saying that the patient is out for 6 weeks , but if he goes back before 6 weeks has to be very light duty.

## 2024-02-21 NOTE — Op Note (Signed)
 Robotic assisted Laparoscopic Transabdominal Right Inguinal Hernia Repair with 3 D large Mesh       Pre-operative Diagnosis:  Re recurrent Inguinal Hernia   Post-operative Diagnosis: Same   Procedure: Robotic  Laparoscopic  repair of Right inguinal hernia   Surgeon: Laneta Luna, MD FACS   Anesthesia: Gen. with endotracheal tube   Findings: Recurrent Right inguinal hernia with a piece of incarcerated urinary bladder within the sac, Anatomy distorted and significant amount of scar tissue within the inguinal region making this case difficult and requiring additional and significant time in dissection and to appropriately restore the anatomy..   Procedure Details  The patient was seen again in the Holding Room. The benefits, complications, treatment options, and expected outcomes were discussed with the patient. The risks of bleeding, infection, recurrence of symptoms, failure to resolve symptoms, recurrence of hernia, ischemic orchitis, chronic pain syndrome or neuroma, were discussed again. The likelihood of improving the patient's symptoms with return to their baseline status is good.  The patient and/or family concurred with the proposed plan, giving informed consent.  The patient was taken to Operating Room, identified  and the procedure verified as Laparoscopic Inguinal Hernia Repair. Laterality confirmed.  A Time Out was held and the above information confirmed.   Prior to the induction of general anesthesia, antibiotic prophylaxis was administered. VTE prophylaxis was in place. General endotracheal anesthesia was then administered and tolerated well. After the induction, the abdomen was prepped with Chloraprep and draped in the sterile fashion. The patient was positioned in the supine position.     Supraumbilical incision created and cut down technique used to enter the abdominal cavity. Fascia elevated and incised and hasson trochar placed. Pneumoperitoneum obtained w/o HD changes. No  evidence of bowel injuries.  Two 8 mm placed under direct vision. The laparoscopy revealed large direct defect on the Right. I inserted the needles and the mesh. The robot was brought ot the table and docked in the standard fashion, no collision between arms was observed. Instruments were kept under direct view at all times. We started on the right side were a flap was created. The sac was reduced and dissected free from adjacent structures. 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  We preserved the vas, the bladder and the vessels at all times w/o any injuries. Once dissection was completed a large 3D mesh was placed and secured with two interrupted vicryl attached to the pubic tubercle. The mesh also laid anterior to the peritoneal reflection both medial and laterally. There was good coverage of the direct, indirect and femoral spaces. The flap was closed with v lock suture.     Once assuring that hemostasis was adequate the ports were removed and a figure-of-eight 0 Vicryl suture was placed at the fascial edges. 4-0 subcuticular Monocryl was used at all skin edges. Dermabond was placed.  Patient tolerated the procedure well. There were no complications. He was taken to the recovery room in stable condition.    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            Jan Walters  Jordis, MD, FACS

## 2024-02-21 NOTE — Transfer of Care (Signed)
 Immediate Anesthesia Transfer of Care Note  Patient: Craig Williamson  Procedure(s) Performed: HERNIORRHAPHY, INGUINAL, ROBOT-ASSISTED, LAPAROSCOPIC (Right: Inguinal)  Patient Location: PACU  Anesthesia Type:General  Level of Consciousness: drowsy  Airway & Oxygen Therapy: Patient Spontanous Breathing  Post-op Assessment: Report given to RN and Post -op Vital signs reviewed and stable  Post vital signs: Reviewed and stable  Last Vitals:  Vitals Value Taken Time  BP 137/87 02/21/24 09:45  Temp 36.3 C 02/21/24 09:45  Pulse 65 02/21/24 09:50  Resp 18 02/21/24 09:50  SpO2 91 % 02/21/24 09:50  Vitals shown include unfiled device data.  Last Pain:  Vitals:   02/21/24 0618  TempSrc: Temporal  PainSc: 0-No pain         Complications: There were no known notable events for this encounter.

## 2024-02-21 NOTE — Anesthesia Preprocedure Evaluation (Signed)
 Anesthesia Evaluation  Patient identified by MRN, date of birth, ID band Patient awake    Reviewed: Allergy & Precautions, H&P , NPO status , Patient's Chart, lab work & pertinent test results, reviewed documented beta blocker date and time   History of Anesthesia Complications Negative for: history of anesthetic complications  Airway Mallampati: III  TM Distance: >3 FB Neck ROM: full    Dental  (+) Dental Advidsory Given, Teeth Intact   Pulmonary neg pulmonary ROS   Pulmonary exam normal breath sounds clear to auscultation       Cardiovascular Exercise Tolerance: Good negative cardio ROS Normal cardiovascular exam Rhythm:regular Rate:Normal     Neuro/Psych negative neurological ROS  negative psych ROS   GI/Hepatic negative GI ROS, Neg liver ROS,,,  Endo/Other  negative endocrine ROS    Renal/GU negative Renal ROS  negative genitourinary   Musculoskeletal   Abdominal   Peds  Hematology negative hematology ROS (+)   Anesthesia Other Findings Past Medical History: No date: Blood pressure elevated without history of HTN 08/23/2010: Breast complaint 08/24/2011: Breast screening, unspecified 08/24/2011: Calculus of gallbladder with other cholecystitis, without  mention of obstruction No date: Hyperlipidemia 08/23/2012: Inguinal hernia without mention of obstruction or  gangrene, recurrent unilateral or unspecified     Comment:  right No date: Mantle cell lymphoma (HCC) 08/24/2011: Obesity, unspecified 08/23/2010: 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 No date: Recurrent right inguinal hernia 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   Reproductive/Obstetrics negative OB ROS                             Anesthesia Physical Anesthesia Plan  ASA: 2  Anesthesia Plan: General   Post-op Pain Management:    Induction: Intravenous  PONV Risk Score and Plan: 2 and Ondansetron,  Dexamethasone, Midazolam and Treatment may vary due to age or medical condition  Airway Management Planned: Oral ETT  Additional Equipment:   Intra-op Plan:   Post-operative Plan: Extubation in OR  Informed Consent: I have reviewed the patients History and Physical, chart, labs and discussed the procedure including the risks, benefits and alternatives for the proposed anesthesia with the patient or authorized representative who has indicated his/her understanding and acceptance.     Dental Advisory Given  Plan Discussed with: Anesthesiologist, CRNA and Surgeon  Anesthesia Plan Comments:        Anesthesia Quick Evaluation

## 2024-02-21 NOTE — Anesthesia Procedure Notes (Signed)
 Procedure Name: Intubation Date/Time: 02/21/2024 7:50 AM  Performed by: Bonnetta Jimmey SAUNDERS, CRNAPre-anesthesia Checklist: Patient identified, Emergency Drugs available, Suction available and Patient being monitored Patient Re-evaluated:Patient Re-evaluated prior to induction Oxygen Delivery Method: Circle system utilized Preoxygenation: Pre-oxygenation with 100% oxygen Induction Type: IV induction Ventilation: Mask ventilation without difficulty Laryngoscope Size: McGrath and 3 Grade View: Grade I Tube type: Oral Tube size: 7.0 mm Number of attempts: 1 Airway Equipment and Method: Stylet and Oral airway Placement Confirmation: ETT inserted through vocal cords under direct vision, positive ETCO2 and breath sounds checked- equal and bilateral Secured at: 21 cm Tube secured with: Tape Dental Injury: Teeth and Oropharynx as per pre-operative assessment

## 2024-02-22 ENCOUNTER — Encounter: Payer: Self-pay | Admitting: Surgery

## 2024-03-05 ENCOUNTER — Encounter: Payer: Self-pay | Admitting: Surgery

## 2024-03-05 ENCOUNTER — Ambulatory Visit (INDEPENDENT_AMBULATORY_CARE_PROVIDER_SITE_OTHER): Admitting: Surgery

## 2024-03-05 VITALS — BP 126/84 | HR 76 | Ht 65.0 in | Wt 214.0 lb

## 2024-03-05 DIAGNOSIS — Z09 Encounter for follow-up examination after completed treatment for conditions other than malignant neoplasm: Secondary | ICD-10-CM

## 2024-03-05 DIAGNOSIS — K4091 Unilateral inguinal hernia, without obstruction or gangrene, recurrent: Secondary | ICD-10-CM

## 2024-03-05 NOTE — Patient Instructions (Addendum)
 Follow-up with our office as needed.  Please call and ask to speak with a nurse if you develop questions or concerns.   INSTRUCCIONES GENERALES POSTOPERATORIAS PARA EL PACIENTE  INSTRUCCIONES PARA EL CUIDADO DE LA HERIDA: Mantenga la herida seca y evite aplicar ungentos a menos que se lo indiquen. Si la herida se enrojece y duele, o comienza a Merchant navy officer infectado que no es transparente, comunquese con su mdico de inmediato. Si la herida est ligeramente rosada y presenta una cresta gruesa y [place], es normal y se denomina cresta de cicatrizacin. Esta desaparecer en las prximas 4 a 6 semanas. BAO: Puede ducharse si su cirujano se lo ha indicado. Sin embargo, no se sumerja en una baera, jacuzzi ni piscina hasta que las incisiones estn completamente selladas o su cirujano se lo haya indicado.  ACTIVIDAD: No debe levantar ms de 9 kg durante las 6 semanas posteriores a la [place], ya que esto podra aumentar el riesgo de complicaciones. 9 kg equivalen aproximadamente a una bolsa de plstico del supermercado. En ese momento: Preste atencin a su cuerpo al levantar objetos. Si siente dolor al sonny, detngase y vuelva a intentarlo en [address] El dolor despus de hacer ejercicio o actividades cotidianas es normal a medida que regresa a su rutina habitual.  BLOQUEADOR SOLAR Use protector solar en la zona de la incisin durante el prximo ao si estar expuesta al sol. Esto ayuda a disminuir las cicatrices y Occupational hygienist que la zona se oscurezca permanentemente sobre la incisin.  PREGUNTAS: Si tiene alguna pregunta, no dude en llamar a nuestra oficina. Con gusto le ayudaremos. [phone]

## 2024-03-05 NOTE — Progress Notes (Signed)
 Craig Williamson is 2 weeks following a robotic right inguinal hernia repair.  He has done very well.  No fevers no chills., minimal pain  PE NAD Abd: soft, incision c/d/I, no evidence of hernia recurrence   A/p Doing very well w/o complications He is very appreciative RTC prn

## 2025-01-04 ENCOUNTER — Other Ambulatory Visit

## 2025-01-11 ENCOUNTER — Ambulatory Visit: Admitting: Oncology
